# Patient Record
Sex: Female | Born: 1960 | Hispanic: Yes | Marital: Married | Smoking: Never smoker
Health system: Southern US, Community
[De-identification: ages and names within clinical notes are randomized; demographics above are authoritative.]

## PROBLEM LIST (undated history)

## (undated) ENCOUNTER — Emergency Department: Payer: BC Managed Care – PPO | Source: Home / Self Care

## (undated) DIAGNOSIS — F419 Anxiety disorder, unspecified: Secondary | ICD-10-CM

## (undated) DIAGNOSIS — J45909 Unspecified asthma, uncomplicated: Secondary | ICD-10-CM

## (undated) HISTORY — PX: BACK SURGERY: SHX140

---

## 1998-05-19 ENCOUNTER — Encounter: Payer: Self-pay | Admitting: Neurosurgery

## 1998-05-19 ENCOUNTER — Ambulatory Visit (HOSPITAL_COMMUNITY): Admission: RE | Admit: 1998-05-19 | Discharge: 1998-05-19 | Payer: Self-pay | Admitting: Neurosurgery

## 1998-10-07 ENCOUNTER — Ambulatory Visit (HOSPITAL_COMMUNITY): Admission: RE | Admit: 1998-10-07 | Discharge: 1998-10-08 | Payer: Self-pay | Admitting: Orthopaedic Surgery

## 2000-03-29 ENCOUNTER — Inpatient Hospital Stay (HOSPITAL_COMMUNITY): Admission: RE | Admit: 2000-03-29 | Discharge: 2000-03-30 | Payer: Self-pay | Admitting: Orthopaedic Surgery

## 2004-09-20 ENCOUNTER — Ambulatory Visit: Payer: Self-pay | Admitting: General Practice

## 2005-01-10 ENCOUNTER — Ambulatory Visit: Payer: Self-pay

## 2005-11-15 ENCOUNTER — Ambulatory Visit: Payer: Self-pay | Admitting: General Practice

## 2006-12-25 ENCOUNTER — Ambulatory Visit: Payer: Self-pay | Admitting: Endocrinology

## 2009-10-20 ENCOUNTER — Ambulatory Visit: Payer: Self-pay | Admitting: Certified Nurse Midwife

## 2012-04-09 ENCOUNTER — Ambulatory Visit: Payer: Self-pay

## 2014-06-29 ENCOUNTER — Ambulatory Visit: Payer: Self-pay | Admitting: Primary Care

## 2014-07-09 ENCOUNTER — Ambulatory Visit: Payer: Self-pay | Admitting: Primary Care

## 2014-10-29 ENCOUNTER — Other Ambulatory Visit: Payer: Self-pay | Admitting: Otolaryngology

## 2014-10-29 DIAGNOSIS — K118 Other diseases of salivary glands: Secondary | ICD-10-CM

## 2014-11-11 ENCOUNTER — Ambulatory Visit: Admission: RE | Admit: 2014-11-11 | Payer: BLUE CROSS/BLUE SHIELD | Source: Ambulatory Visit

## 2014-11-17 ENCOUNTER — Ambulatory Visit
Admission: RE | Admit: 2014-11-17 | Discharge: 2014-11-17 | Disposition: A | Discharge status: Home / Self Care | Payer: BLUE CROSS/BLUE SHIELD | Source: Ambulatory Visit | Attending: Otolaryngology | Admitting: Otolaryngology

## 2014-11-17 DIAGNOSIS — R590 Localized enlarged lymph nodes: Secondary | ICD-10-CM | POA: Insufficient documentation

## 2014-11-17 DIAGNOSIS — K118 Other diseases of salivary glands: Secondary | ICD-10-CM

## 2014-11-17 DIAGNOSIS — R599 Enlarged lymph nodes, unspecified: Secondary | ICD-10-CM | POA: Diagnosis present

## 2014-11-17 MED ORDER — IOHEXOL 300 MG/ML  SOLN
75.0000 mL | Freq: Once | INTRAMUSCULAR | Status: AC | PRN
  Administered 2014-11-17: 75 mL via INTRAVENOUS

## 2015-09-15 ENCOUNTER — Other Ambulatory Visit: Payer: Self-pay | Admitting: Primary Care

## 2015-09-15 DIAGNOSIS — R928 Other abnormal and inconclusive findings on diagnostic imaging of breast: Secondary | ICD-10-CM

## 2015-09-30 ENCOUNTER — Other Ambulatory Visit: Payer: BLUE CROSS/BLUE SHIELD

## 2015-09-30 ENCOUNTER — Ambulatory Visit: Payer: BLUE CROSS/BLUE SHIELD

## 2015-10-11 ENCOUNTER — Ambulatory Visit
Admission: RE | Admit: 2015-10-11 | Discharge: 2015-10-11 | Disposition: A | Discharge status: Home / Self Care | Payer: BLUE CROSS/BLUE SHIELD | Source: Ambulatory Visit | Attending: Primary Care | Admitting: Primary Care

## 2015-10-11 DIAGNOSIS — R928 Other abnormal and inconclusive findings on diagnostic imaging of breast: Secondary | ICD-10-CM | POA: Diagnosis present

## 2015-10-11 DIAGNOSIS — R921 Mammographic calcification found on diagnostic imaging of breast: Secondary | ICD-10-CM | POA: Insufficient documentation

## 2015-12-30 IMAGING — MG MM ADDL VIEWS W/ TOMO
8 series · 8 of 16 positions shown · non-contrast
Comparison: 06/29/2014 and prior mammograms dating back to
09/20/2004.

CLINICAL DATA: 53-year-old female with right breast calcifications
identified on screening mammogram.

EXAM:
DIGITAL DIAGNOSTIC RIGHT MAMMOGRAM WITH 3D TOMOSYNTHESIS AND CAD

[R ML]
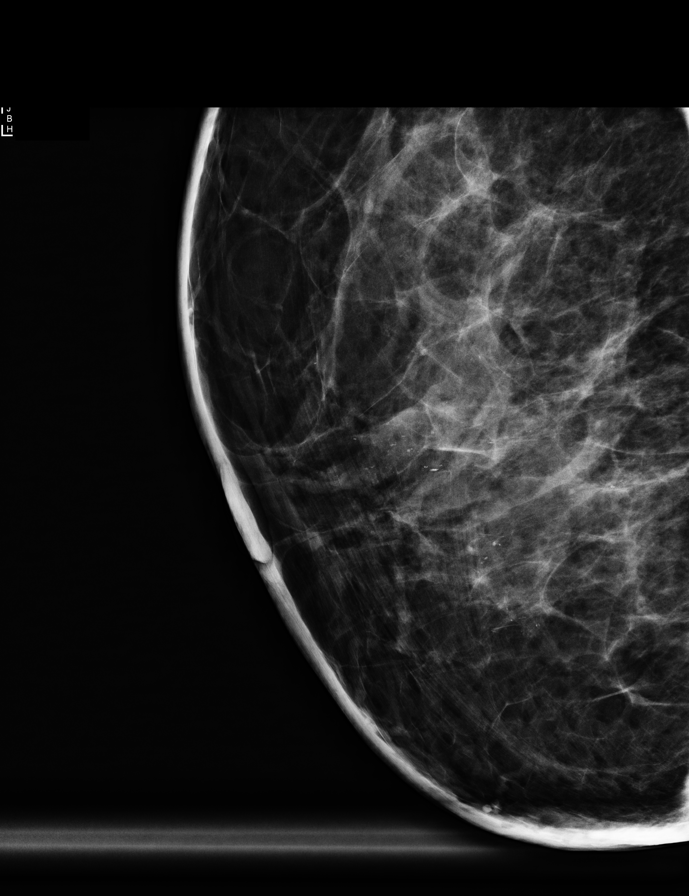

[R CC (1 of 2)]
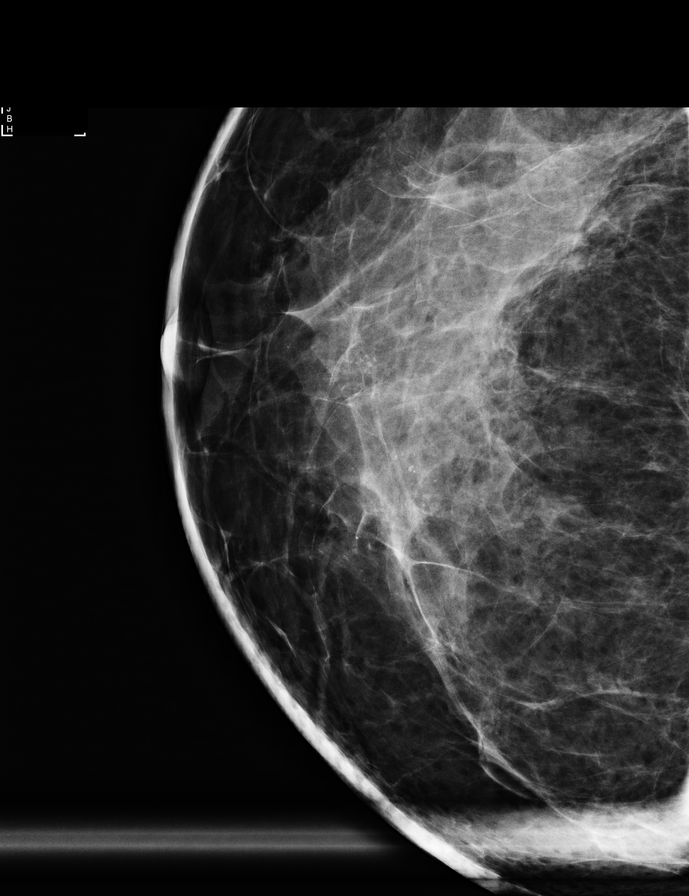

[R CC (2 of 2)]
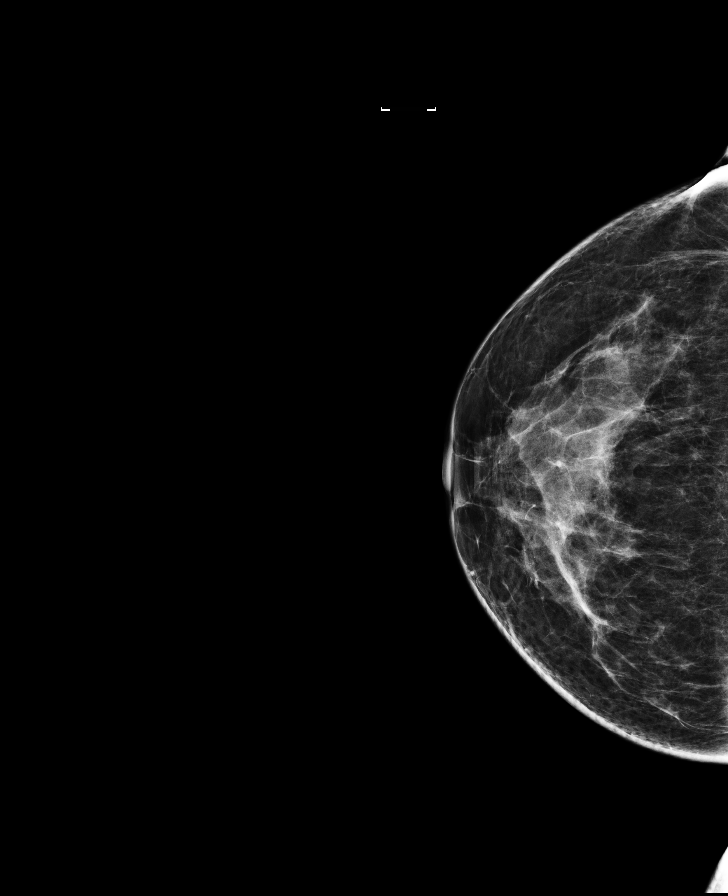

[R MLO synth-2D]
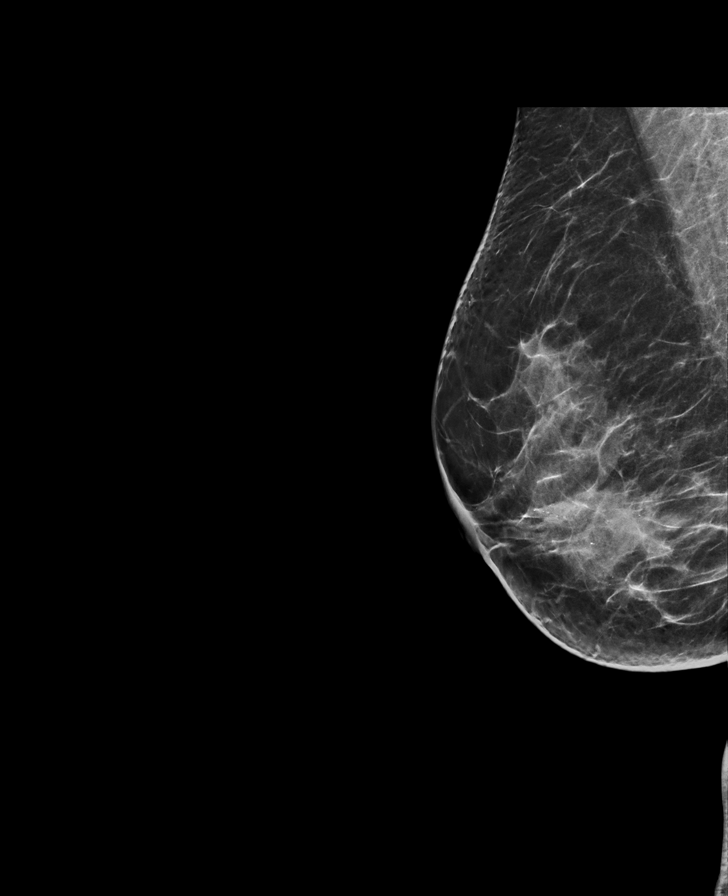

[R MLO]
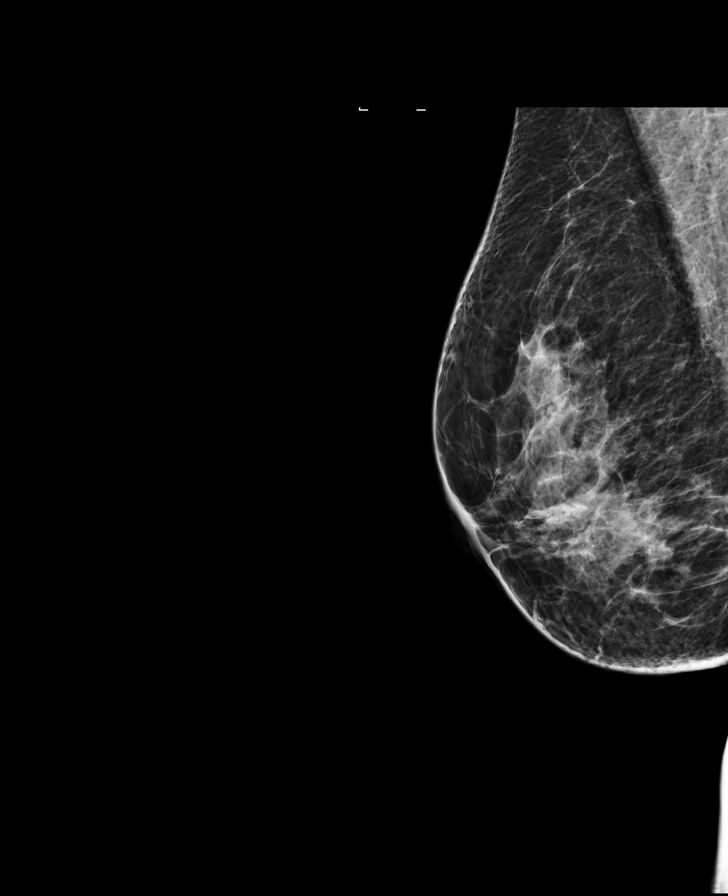

[R CC synth-2D]
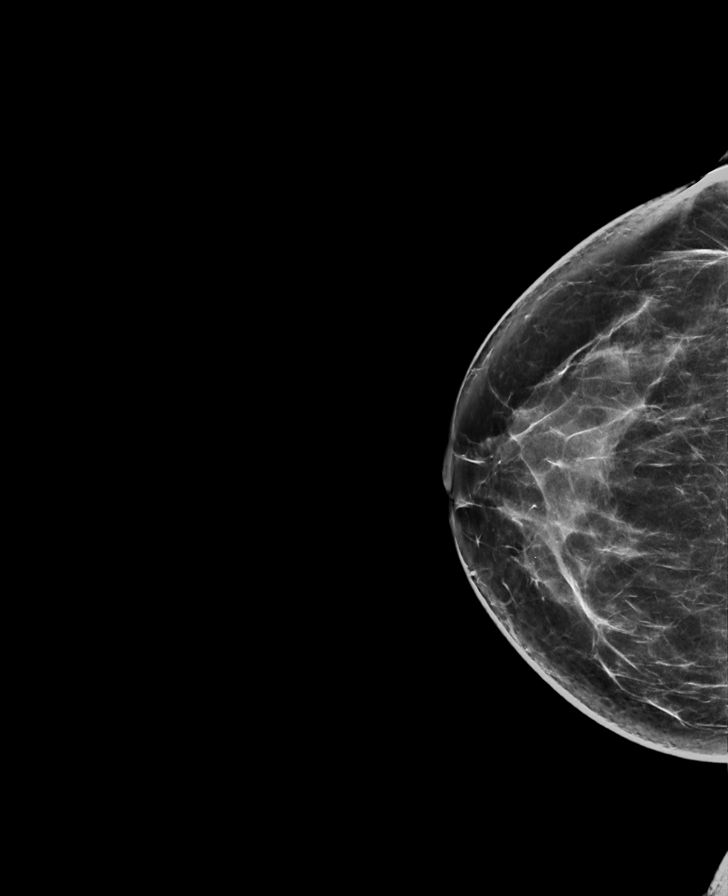

[R CC tomo · tomo slice 40/79.0]
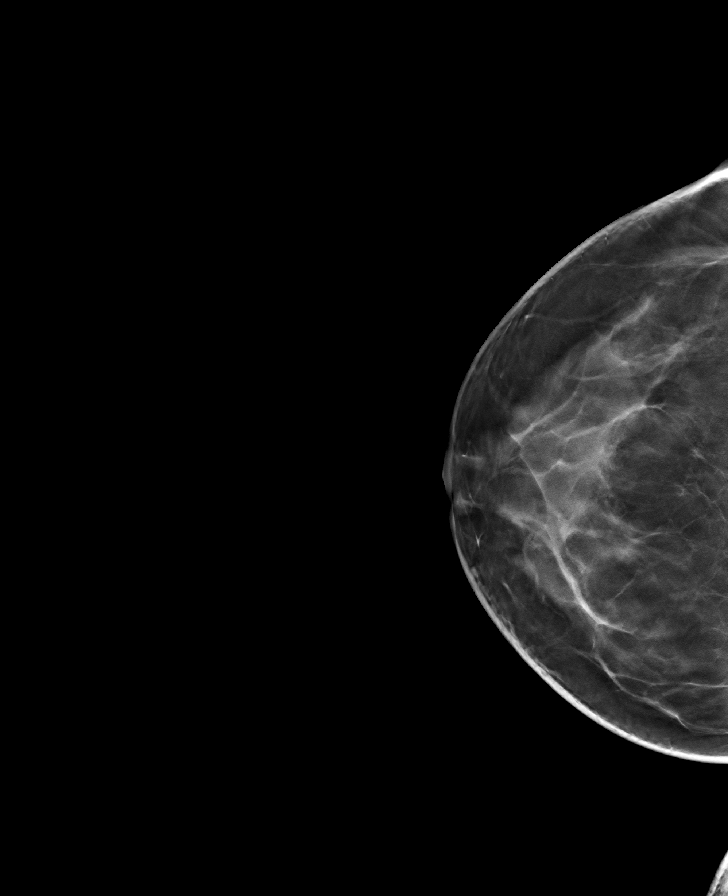

[R MLO tomo · tomo slice 38/75.0]
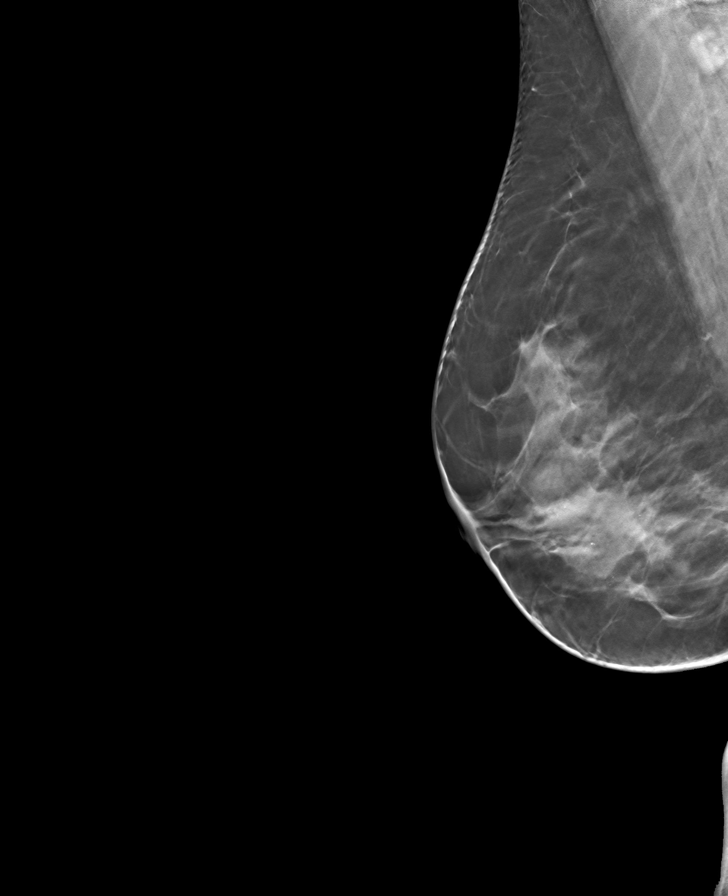

[8 of 16 positions shown; findings below may reference images not displayed]

The patient states she has had mammograms in 1031 at
[facility] [[facility]] and our office will attempt to retrieve these
mammograms.

ACR Breast Density Category c: The breast tissue is heterogeneously
dense, which may obscure small masses.
FINDINGS: Spot magnification views and full field 2D and 3D views of the right
breast demonstrate scattered calcifications within the retroareolar
and lower inner right breast. Many of the calcifications within the
retroareolar right breast layer compatible with milk of calcium.
There is equivocal layering of calcifications within the lower inner
right breast.

Mammographic images were processed with CAD.
IMPRESSION: Benign and likely benign right breast calcifications. Six-month
followup is recommended to ensure stability.

RECOMMENDATION:
Right diagnostic mammogram with spot-magnification views in 6
months. If prior outside mammograms are obtained, an addendum will
be issued.

I have discussed the findings and recommendations with the patient.
Results were also provided in writing at the conclusion of the
visit. If applicable, a reminder letter will be sent to the patient
regarding the next appointment.

BI-RADS CATEGORY  3: Probably benign.

## 2016-02-06 ENCOUNTER — Other Ambulatory Visit: Payer: Self-pay | Admitting: Otolaryngology

## 2016-02-06 DIAGNOSIS — R221 Localized swelling, mass and lump, neck: Secondary | ICD-10-CM

## 2016-02-10 ENCOUNTER — Ambulatory Visit
Admission: RE | Admit: 2016-02-10 | Discharge: 2016-02-10 | Disposition: A | Discharge status: Home / Self Care | Payer: BLUE CROSS/BLUE SHIELD | Source: Ambulatory Visit | Attending: Otolaryngology | Admitting: Otolaryngology

## 2016-02-10 DIAGNOSIS — R221 Localized swelling, mass and lump, neck: Secondary | ICD-10-CM | POA: Insufficient documentation

## 2016-02-10 MED ORDER — IOPAMIDOL (ISOVUE-300) INJECTION 61%
75.0000 mL | Freq: Once | INTRAVENOUS | Status: AC | PRN
Start: 2016-02-10 — End: 2016-02-10
  Administered 2016-02-10: 75 mL via INTRAVENOUS

## 2016-04-10 ENCOUNTER — Encounter
Admission: RE | Admit: 2016-04-10 | Discharge: 2016-04-10 | Disposition: A | Discharge status: Home / Self Care | Payer: BLUE CROSS/BLUE SHIELD | Source: Ambulatory Visit | Attending: Otolaryngology | Admitting: Otolaryngology

## 2016-04-10 DIAGNOSIS — Z01812 Encounter for preprocedural laboratory examination: Secondary | ICD-10-CM | POA: Insufficient documentation

## 2016-04-10 HISTORY — DX: Anxiety disorder, unspecified: F41.9

## 2016-04-10 HISTORY — DX: Unspecified asthma, uncomplicated: J45.909

## 2016-04-10 LAB — CBC
HCT: 39.7 % (ref 35.0–47.0)
HEMOGLOBIN: 13.4 g/dL (ref 12.0–16.0)
MCH: 30.8 pg (ref 26.0–34.0)
MCHC: 33.7 g/dL (ref 32.0–36.0)
MCV: 91.5 fL (ref 80.0–100.0)
Platelets: 286 10*3/uL (ref 150–440)
RBC: 4.34 MIL/uL (ref 3.80–5.20)
RDW: 13.4 % (ref 11.5–14.5)
WBC: 7.5 10*3/uL (ref 3.6–11.0)

## 2016-04-10 LAB — DIFFERENTIAL
Basophils Absolute: 0 10*3/uL (ref 0–0.1)
Basophils Relative: 0 %
EOS ABS: 0.2 10*3/uL (ref 0–0.7)
EOS PCT: 3 %
LYMPHS ABS: 3.7 10*3/uL — AB (ref 1.0–3.6)
LYMPHS PCT: 49 %
MONO ABS: 0.6 10*3/uL (ref 0.2–0.9)
MONOS PCT: 7 %
Neutro Abs: 3.1 10*3/uL (ref 1.4–6.5)
Neutrophils Relative %: 41 %

## 2016-04-10 NOTE — Patient Instructions (Signed)
Your procedure is scheduled on: April 25, 2016 (Wednesday) Su procedimiento est programado para: Report to  Same Day Surgery. Medical Mall, Second Floor Presntese a: To find out your arrival time please call [phone] between 1PM - 3PM on April 24, 2016 (Tuesday). Para saber su hora de llegada por favor llame al [phone] entre la 1PM - 3PM el da:  Remember: Instructions that are not followed completely may result in serious medical risk, up to and including death, or upon the discretion of your surgeon and anesthesiologist your surgery may need to be rescheduled.  Recuerde: Las instrucciones que no se siguen completamente Heritage manager en un riesgo de salud grave, incluyendo hasta la [place] o a discrecin de su cirujano y Environmental health practitioner, su ciruga se puede posponer.   _x___ 1. Do not eat food or drink liquids after midnight. No gum chewing or hard candies.  No coma alimentos ni tome lquidos despus de la medianoche.  No mastique chicle ni caramelos  duros.     _x___ 2. No alcohol for 24 hours before or after surgery.    No tome alcohol durante las 24 horas antes ni despus de la [place].   ____ 3. Bring all medications with you on the day of surgery if instructed.    Lleve todos los medicamentos con usted el da de su ciruga si se le ha indicado as.   __x__ 4. Notify your doctor if there is any change in your medical condition (cold, fever,                             infections).    Informe a su mdico si hay algn cambio en su condicin mdica (resfriado, fiebre, infecciones).   Do not wear jewelry, make-up, hairpins, clips or nail polish.  No use joyas, maquillajes, pinzas/ganchos para el cabello ni esmalte de uas.  Do not wear lotions, powders, or perfumes. You may wear deodorant.  No use lociones, polvos o perfumes.  Puede usar desodorante.    Do not shave 48 hours prior to surgery. Men may shave face and neck.  No se afeite 48 horas antes de la [place].   Los hombres pueden [employer] cara y el cuello.   Do not bring valuables to the hospital.   No lleve objetos [facility] is not responsible for any belongings or valuables.  [place] no se hace responsable de ningn tipo de pertenencias u objetos de Geographical information systems officer.               Contacts, dentures or bridgework may not be worn into surgery.  Los lentes de [place], las dentaduras postizas o puentes no se pueden usar en la [place].  Leave your suitcase in the car. After surgery it may be brought to your room.  Deje su maleta en el auto.  Despus de la ciruga podr traerla a su habitacin.  For patients admitted to the hospital, discharge time is determined by your treatment team.  Para los pacientes que sean ingresados al hospital, el tiempo en el cual se le dar de alta es determinado por su                equipo de [place].   Patients discharged the day of surgery will not be allowed to drive home. A los pacientes que se les da de alta el mismo da de la ciruga no se les permitir conducir a Holiday representative.  Please read over the following fact sheets that you were given: Por favor [place] informacin que le dieron:     ____ Take these medicines the morning of surgery with A SIP OF WATER:          [place] de la ciruga con [place]:  1.   2.   3.   4.       5.  6.  ____ Fleet Enema (as directed)          Enema de Fleet (segn lo indicado)    ____ Use CHG Soap as directed          Utilice el jabn de CHG segn lo indicado  ____ Use inhalers on the day of surgery          Use los inhaladores el da de la ciruga  ____ Stop metformin 2 days prior to surgery          Deje de tomar el metformin 2 das antes de la ciruga    ____ Take 1/2 of usual insulin dose the night before surgery and none on the morning of surgery           Tome la mitad de la dosis habitual de insulina la noche antes de la [place] y no tome  nada en la maana de la             ciruga  __x__ Stop Coumadin/Plavix/aspirin on (NO ASPIRIN)          Deje de tomar el Coumadin/Plavix/aspirina el da:  _x___ Stop Anti-inflammatories on (NO NSAIDS)          Deje de tomar antiinflamatorios el da:   ____ Stop supplements until after surgery            Deje de tomar suplementos hasta despus de la ciruga  ____ Bring C-Pap to the hospital          [place] al hospital

## 2016-04-25 ENCOUNTER — Ambulatory Visit
Admission: RE | Admit: 2016-04-25 | Discharge: 2016-04-25 | Disposition: A | Discharge status: Home / Self Care | Payer: BLUE CROSS/BLUE SHIELD | Source: Ambulatory Visit | Attending: Otolaryngology | Admitting: Otolaryngology

## 2016-04-25 ENCOUNTER — Encounter
Admission: RE | Disposition: A | Discharge status: Home / Self Care | Payer: Self-pay | Source: Ambulatory Visit | Attending: Otolaryngology

## 2016-04-25 ENCOUNTER — Ambulatory Visit: Payer: BLUE CROSS/BLUE SHIELD | Admitting: Anesthesiology

## 2016-04-25 ENCOUNTER — Encounter: Payer: Self-pay | Admitting: *Deleted

## 2016-04-25 ENCOUNTER — Other Ambulatory Visit
Admission: RE | Admit: 2016-04-25 | Discharge: 2016-04-25 | Disposition: A | Discharge status: Home / Self Care | Payer: BLUE CROSS/BLUE SHIELD | Source: Ambulatory Visit | Attending: Otolaryngology | Admitting: Otolaryngology

## 2016-04-25 DIAGNOSIS — F419 Anxiety disorder, unspecified: Secondary | ICD-10-CM | POA: Insufficient documentation

## 2016-04-25 DIAGNOSIS — L0211 Cutaneous abscess of neck: Secondary | ICD-10-CM | POA: Insufficient documentation

## 2016-04-25 DIAGNOSIS — I881 Chronic lymphadenitis, except mesenteric: Secondary | ICD-10-CM | POA: Diagnosis present

## 2016-04-25 HISTORY — PX: LYMPH NODE BIOPSY: SHX201

## 2016-04-25 LAB — POCT PREGNANCY, URINE: Preg Test, Ur: NEGATIVE

## 2016-04-25 SURGERY — LYMPH NODE BIOPSY
Anesthesia: General | Laterality: Left | Wound class: Clean

## 2016-04-25 MED ORDER — DEXAMETHASONE SODIUM PHOSPHATE 10 MG/ML IJ SOLN
INTRAMUSCULAR | Status: DC | PRN
  Administered 2016-04-25: 10 mg via INTRAVENOUS

## 2016-04-25 MED ORDER — DEXAMETHASONE SODIUM PHOSPHATE 10 MG/ML IJ SOLN
INTRAMUSCULAR | Status: AC
  Filled 2016-04-25: qty 1

## 2016-04-25 MED ORDER — DOXYCYCLINE HYCLATE 100 MG PO CAPS: ORAL_CAPSULE | ORAL | 0 refills | Status: DC

## 2016-04-25 MED ORDER — FENTANYL CITRATE (PF) 100 MCG/2ML IJ SOLN
INTRAMUSCULAR | Status: AC
  Filled 2016-04-25: qty 2

## 2016-04-25 MED ORDER — PHENYLEPHRINE 40 MCG/ML (10ML) SYRINGE FOR IV PUSH (FOR BLOOD PRESSURE SUPPORT)
PREFILLED_SYRINGE | INTRAVENOUS | Status: AC
  Filled 2016-04-25: qty 10

## 2016-04-25 MED ORDER — FENTANYL CITRATE (PF) 100 MCG/2ML IJ SOLN
INTRAMUSCULAR | Status: DC | PRN
  Administered 2016-04-25 (×2): 50 ug via INTRAVENOUS

## 2016-04-25 MED ORDER — PHENYLEPHRINE HCL 10 MG/ML IJ SOLN
INTRAMUSCULAR | Status: DC | PRN
  Administered 2016-04-25 (×6): 80 ug via INTRAVENOUS

## 2016-04-25 MED ORDER — BACITRACIN 500 UNIT/GM EX OINT
TOPICAL_OINTMENT | CUTANEOUS | Status: DC | PRN
  Administered 2016-04-25: 1 via TOPICAL

## 2016-04-25 MED ORDER — SUGAMMADEX SODIUM 200 MG/2ML IV SOLN
INTRAVENOUS | Status: AC
  Filled 2016-04-25: qty 2

## 2016-04-25 MED ORDER — HYDROCODONE-ACETAMINOPHEN 5-325 MG PO TABS: 1.0000 | ORAL_TABLET | ORAL | 0 refills | Status: DC | PRN

## 2016-04-25 MED ORDER — LIDOCAINE-EPINEPHRINE 2 %-1:100000 IJ SOLN
INTRAMUSCULAR | Status: DC | PRN
  Administered 2016-04-25: 3 mL via INTRADERMAL

## 2016-04-25 MED ORDER — PROPOFOL 10 MG/ML IV BOLUS
INTRAVENOUS | Status: DC | PRN
  Administered 2016-04-25: 160 mg via INTRAVENOUS

## 2016-04-25 MED ORDER — SUCCINYLCHOLINE CHLORIDE 20 MG/ML IJ SOLN
INTRAMUSCULAR | Status: DC | PRN
  Administered 2016-04-25: 120 mg via INTRAVENOUS

## 2016-04-25 MED ORDER — ONDANSETRON HCL 4 MG/2ML IJ SOLN
INTRAMUSCULAR | Status: AC
  Filled 2016-04-25: qty 2

## 2016-04-25 MED ORDER — FENTANYL CITRATE (PF) 100 MCG/2ML IJ SOLN: 25.0000 ug | INTRAMUSCULAR | Status: DC | PRN

## 2016-04-25 MED ORDER — EPHEDRINE SULFATE 50 MG/ML IJ SOLN
INTRAMUSCULAR | Status: DC | PRN
  Administered 2016-04-25: 5 mg via INTRAVENOUS
  Administered 2016-04-25: 10 mg via INTRAVENOUS

## 2016-04-25 MED ORDER — SUCCINYLCHOLINE CHLORIDE 200 MG/10ML IV SOSY
PREFILLED_SYRINGE | INTRAVENOUS | Status: AC
  Filled 2016-04-25: qty 10

## 2016-04-25 MED ORDER — LIDOCAINE-EPINEPHRINE 2 %-1:100000 IJ SOLN
INTRAMUSCULAR | Status: AC
  Filled 2016-04-25: qty 50

## 2016-04-25 MED ORDER — LACTATED RINGERS IV SOLN
INTRAVENOUS | Status: DC | PRN
  Administered 2016-04-25: 13:00:00 via INTRAVENOUS

## 2016-04-25 MED ORDER — MIDAZOLAM HCL 2 MG/2ML IJ SOLN
INTRAMUSCULAR | Status: DC | PRN
  Administered 2016-04-25: 2 mg via INTRAVENOUS

## 2016-04-25 MED ORDER — PROPOFOL 10 MG/ML IV BOLUS
INTRAVENOUS | Status: AC
  Filled 2016-04-25: qty 20

## 2016-04-25 MED ORDER — BACITRACIN ZINC 500 UNIT/GM EX OINT
TOPICAL_OINTMENT | CUTANEOUS | Status: AC
  Filled 2016-04-25: qty 28.35

## 2016-04-25 MED ORDER — MIDAZOLAM HCL 2 MG/2ML IJ SOLN
INTRAMUSCULAR | Status: AC
Start: 2016-04-25 — End: 2016-04-25
  Filled 2016-04-25: qty 2

## 2016-04-25 MED ORDER — LIDOCAINE 2% (20 MG/ML) 5 ML SYRINGE
INTRAMUSCULAR | Status: AC
  Filled 2016-04-25: qty 5

## 2016-04-25 MED ORDER — LIDOCAINE HCL (CARDIAC) 20 MG/ML IV SOLN
INTRAVENOUS | Status: DC | PRN
  Administered 2016-04-25: 80 mg via INTRAVENOUS

## 2016-04-25 MED ORDER — PROMETHAZINE HCL 25 MG/ML IJ SOLN: 6.2500 mg | INTRAMUSCULAR | Status: DC | PRN

## 2016-04-25 MED ORDER — LACTATED RINGERS IV SOLN
INTRAVENOUS | Status: DC
  Administered 2016-04-25: 11:00:00 via INTRAVENOUS

## 2016-04-25 MED ORDER — ONDANSETRON HCL 4 MG/2ML IJ SOLN
INTRAMUSCULAR | Status: DC | PRN
  Administered 2016-04-25: 4 mg via INTRAVENOUS

## 2016-04-25 SURGICAL SUPPLY — 25 items
BLADE SURG 15 STRL LF DISP TIS (BLADE) ×1 IMPLANT
BLADE SURG 15 STRL SS (BLADE) ×3
CANISTER SUCT 1200ML W/VALVE (MISCELLANEOUS) ×3 IMPLANT
CORD BIP STRL DISP 12FT (MISCELLANEOUS) ×3 IMPLANT
DRESSING TELFA 4X3 1S ST N-ADH (GAUZE/BANDAGES/DRESSINGS) ×3 IMPLANT
DRSG TEGADERM 2-3/8X2-3/4 SM (GAUZE/BANDAGES/DRESSINGS) ×3 IMPLANT
ELECT CAUTERY BLADE TIP 2.5 (TIP) ×3
ELECT REM PT RETURN 9FT ADLT (ELECTROSURGICAL) ×3
ELECTRODE CAUTERY BLDE TIP 2.5 (TIP) ×1 IMPLANT
ELECTRODE REM PT RTRN 9FT ADLT (ELECTROSURGICAL) ×1 IMPLANT
FORCEPS JEWEL BIP 4-3/4 STR (INSTRUMENTS) ×3 IMPLANT
GLOVE BIO SURGEON STRL SZ7.5 (GLOVE) ×5 IMPLANT
GOWN STRL REUS W/ TWL LRG LVL3 (GOWN DISPOSABLE) ×2 IMPLANT
GOWN STRL REUS W/TWL LRG LVL3 (GOWN DISPOSABLE) ×6
HARMONIC SCALPEL FOCUS (MISCELLANEOUS) ×3 IMPLANT
LABEL OR SOLS (LABEL) ×3 IMPLANT
LIQUID BAND (GAUZE/BANDAGES/DRESSINGS) ×1 IMPLANT
NS IRRIG 500ML POUR BTL (IV SOLUTION) ×3 IMPLANT
PACK HEAD/NECK (MISCELLANEOUS) ×3 IMPLANT
SUCTION FRAZIER HANDLE 10FR (MISCELLANEOUS) ×2
SUCTION TUBE FRAZIER 10FR DISP (MISCELLANEOUS) ×1 IMPLANT
SUT SILK 3 0 (SUTURE)
SUT SILK 3-0 18XBRD TIE 12 (SUTURE) ×1 IMPLANT
SUT VIC AB 4-0 FS2 27 (SUTURE) ×1 IMPLANT
SUT VIC AB 4-0 RB1 18 (SUTURE) ×3 IMPLANT

## 2016-04-25 NOTE — Anesthesia Postprocedure Evaluation (Signed)
Anesthesia Post Note  Patient: Jessica Fischer  Procedure(s) Performed: Procedure(s) (LRB): LYMPH NODE BIOPSY (Left)  Patient location during evaluation: PACU Anesthesia Type: General Level of consciousness: awake and alert and oriented Pain management: pain level controlled Vital Signs Assessment: post-procedure vital signs reviewed and stable Respiratory status: spontaneous breathing, nonlabored ventilation and respiratory function stable Cardiovascular status: blood pressure returned to baseline and stable Postop Assessment: no signs of nausea or vomiting Anesthetic complications: no     Last Vitals:  Vitals:   04/25/16 1423 04/25/16 1426  BP:  129/84  Pulse: 81 74  Resp: 18 10  Temp:  36.2 C    Last Pain:  Vitals:   04/25/16 1103  TempSrc: Tympanic                 [name]

## 2016-04-25 NOTE — Transfer of Care (Signed)
Immediate Anesthesia Transfer of Care Note  Patient: Jessica Fischer  Procedure(s) Performed: Procedure(s): LYMPH NODE BIOPSY (Left)  Patient Location: PACU  Anesthesia Type:General  Level of Consciousness: awake, alert  and responds to stimulation  Airway & Oxygen Therapy: Patient Spontanous Breathing and Patient connected to nasal cannula oxygen  Post-op Assessment: Report given to RN and Post -op Vital signs reviewed and stable  Post vital signs: Reviewed and stable  Last Vitals:  Vitals:   04/25/16 1103 04/25/16 1355  BP: (!) 153/86 121/79  Pulse: 77 93  Resp: 16 (!) 25  Temp: 36.2 C 36.1 C    Last Pain:  Vitals:   04/25/16 1103  TempSrc: Tympanic         Complications: No apparent anesthesia complications

## 2016-04-25 NOTE — H&P (Signed)
History and physical reviewed and will be scanned in later. No change in medical status reported by the patient or family, appears stable for surgery. All questions regarding the procedure answered, and patient (or family if a child) expressed understanding of the procedure.  Jessica Fischer S @TODAY@ 

## 2016-04-25 NOTE — Op Note (Signed)
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 Node was sent for pathology and some of the material from within the node sent for culture  Procedure: After the patient was identified in holding and the procedure was reviewed.  The patient was taken to the operating room and with the patient in a comfortable supine position,  general endotracheal anesthesia was induced without difficulty.  A proper time-out was performed, confirming the operative site and procedure.  2% lidocaine with epinephrine 1 100,000 was injected in the skin overlying the mass. The left neck was then prepped and draped in the usual sterile fashion. Facial nerve monitor electrodes were placed to monitor the lower lip. Proper functioning was confirmed.  The skin overlying the lymph node was then excised in an ellipse, as the skin immediately over the node was inflamed and erythematous. An adequate skin margin was taken so that normal healthy skin remained. The incision was carried down to the subcutaneous fatty tissues and the node carefully dissected. The node was adherent to the underlying platysma muscle and was carefully dissected off with a combination of sharp and bipolar cautery dissection. The lip was carefully monitored both visually and with the facial nerve monitor during this dissection. While some of the platysma muscle might have been focally removed with the inflamed mass, grossly the muscle was not significantly violated, reducing any risk to the marginal nerve.  The specimen was delivered and  sent for pathology, and a culture taken from a focal area of the node that had ruptured. None of the purulent material was spilled into the wound however. The wound was irrigated with sterile saline and hemostasis obtained. The wound was then closed in layers with 4-0 Vicryl suture for the subcutaneous closure followed by 5-0 Prolene suture in a running locked stitch for the skin. Bacitracin ointment was applied to the wound.  The patient was then returned to the anesthesiologist in good condition for awakening. The patient was awakened and taken to the recovery room in good condition.   Disposition:   PACU then discharge home  Plan: Bacitracin ointment to wound twice daily. Follow-up in 1 week for suture removal.  Riley Nearing 04/25/2016 1:46 PM

## 2016-04-25 NOTE — Anesthesia Preprocedure Evaluation (Signed)
Anesthesia Evaluation  Patient identified by MRN, date of birth, ID band Patient awake    Reviewed: Allergy & Precautions, H&P , NPO status , Patient's Chart, lab work & pertinent test results, reviewed documented beta blocker date and time   Airway Mallampati: III  TM Distance: >3 FB Neck ROM: full    Dental no notable dental hx. (+) Teeth Intact   Pulmonary neg shortness of breath, asthma , neg recent URI,    Pulmonary exam normal breath sounds clear to auscultation       Cardiovascular Exercise Tolerance: Good negative cardio ROS Normal cardiovascular exam Rhythm:regular Rate:Normal     Neuro/Psych PSYCHIATRIC DISORDERS (anxiety) negative neurological ROS     GI/Hepatic negative GI ROS, Neg liver ROS,   Endo/Other  negative endocrine ROS  Renal/GU negative Renal ROS  negative genitourinary   Musculoskeletal   Abdominal   Peds  Hematology negative hematology ROS (+)   Anesthesia Other Findings Past Medical History: No date: Anxiety No date: Asthma     Comment: NO INHALERS   Reproductive/Obstetrics negative OB ROS                             Anesthesia Physical Anesthesia Plan  ASA: II  Anesthesia Plan: General   Post-op Pain Management:    Induction:   Airway Management Planned:   Additional Equipment:   Intra-op Plan:   Post-operative Plan:   Informed Consent: I have reviewed the patients History and Physical, chart, labs and discussed the procedure including the risks, benefits and alternatives for the proposed anesthesia with the patient or authorized representative who has indicated his/her understanding and acceptance.   Dental Advisory Given  Plan Discussed with: Anesthesiologist, CRNA and Surgeon  Anesthesia Plan Comments:         Anesthesia Quick Evaluation

## 2016-04-25 NOTE — Discharge Instructions (Signed)
CIRUGIA AMBULATORIA       Instruccionnes de alta    Date (Fecha)   1.  Las drogas que se Statistician en su cuerpo [employer], asi  que por las proximas 24 horas usted no debe:   Conducir Scientist, research (medical)) un automovil   Hacer ninguna decision legal   Tomar ninguna bebida alcoholica  2.  A) [place] puede comenzar una dieta regular.  Es mejor que hoy empiece con  liquidos y gradualmente anada comidas solidas.       B) Puede comer cualquier comida que desee pero es mejor empezar con liquidos, luego sopitas con galletas saladas y gradualmente llegar a las comidas solidas.  3.  Por favor avise a su medico inmediatamente si usted tiene algun sangrado anormal,  tiene dificultad con la respiracion, enrojecimiento y Social research officer, government en el sitio de la cirugia,     [place], fiebro o dolor que se alivia con [place].  4.  A) Su visita posoperatoria (despues de su operacion) es con el  Dr.  Date                   Time         B)  Por favor llame para hacer la cita posoperatoria.  5.  Istrucciones especificas :    CIRUGIA AMBULATORIA       Instruccionnes de alta    Date (Fecha)   1.  Las drogas que se Statistician en su cuerpo [employer], asi  que por las proximas 24 horas usted no debe:   Conducir Scientist, research (medical)) un automovil   Hacer ninguna decision legal   Tomar ninguna bebida alcoholica  2.  A) [place] puede comenzar una dieta regular.  Es mejor que hoy empiece con liquidos y gradualmente anada comidas solidas.       B) Puede comer cualquier comida que desee pero es mejor empezar con liquidos,  luego sopitas con galletas saladas y gradualmente llegar a las comidas solidas.  3.  Por favor avise a su medico inmediatamente si usted tiene algun sangrado anormal,  tiene dificultad con la respiracion, enrojecimiento y Social research officer, government en el sitio de la cirugia,     [place], fiebro o dolor que se alivia con [place].  4.  A) Su visita posoperatoria (despues de su operacion) es con  eAMBULATORY SURGERY  DISCHARGE INSTRUCTIONS   1) The drugs that you were given will stay in your system until tomorrow so for the next 24 hours you should not:  A) Drive an automobile B) Make any legal decisions C) Drink any alcoholic beverage   2) You may resume regular meals tomorrow.  Today it is better to start with liquids and gradually work up to solid foods.  You may eat anything you prefer, but it is better to start with liquids, then soup and crackers, and gradually work up to solid foods.   3) Please notify your doctor immediately if you have any unusual bleeding, trouble breathing, redness and pain at the surgery site, drainage, fever, or pain not relieved by medication.    4) Additional Instructions:        Please contact your physician with any problems or Same Day Surgery at [phone], Monday through Friday 6 am to 4 pm, or [place] at [facility] number at [phone].AMBULATORY SURGERY  DISCHARGE INSTRUCTIONS   5) The drugs that you were given will stay in your system until tomorrow so for the next 24 hours you should not:  D) Drive  an automobile E) Make any legal decisions F) Drink any alcoholic beverage   6) You may resume regular meals tomorrow.  Today it is better to start with liquids and gradually work up to solid foods.  You may eat anything you prefer, but it is better to start with liquids, then soup and crackers, and gradually work up to solid foods.   7) Please notify your doctor immediately if you have any unusual bleeding, trouble breathing, redness and pain at the surgery site, drainage, fever, or pain not relieved by medication.    8) Additional Instructions:        Please contact your physician with any problems or Same Day Surgery at [phone], Monday through Friday 6 am to 4 pm, or [place] at [facility] number at [phone].                Time         B)  Por favor llame para hacer la cita  posoperatoria.  5.  Istrucciones especificas :

## 2016-04-25 NOTE — Anesthesia Procedure Notes (Signed)
Procedure Name: Intubation Date/Time: 04/25/2016 1:02 PM Performed by: Darlyne Russian Pre-anesthesia Checklist: Patient identified, Emergency Drugs available, Suction available, Patient being monitored and Timeout performed Patient Re-evaluated:Patient Re-evaluated prior to inductionOxygen Delivery Method: Circle system utilized Preoxygenation: Pre-oxygenation with 100% oxygen Intubation Type: IV induction Ventilation: Mask ventilation without difficulty Laryngoscope Size: Mac and 3 Grade View: Grade III Tube type: Oral Tube size: 7.0 mm Number of attempts: 1 Airway Equipment and Method: Stylet Placement Confirmation: ETT inserted through vocal cords under direct vision,  positive ETCO2 and breath sounds checked- equal and bilateral Secured at: 21 cm Tube secured with: Tape Dental Injury: Teeth and Oropharynx as per pre-operative assessment

## 2016-04-25 NOTE — Progress Notes (Signed)
Throat sore  Ice chips given

## 2016-04-27 LAB — SURGICAL PATHOLOGY

## 2016-04-28 LAB — ACID FAST SMEAR (AFB): ACID FAST SMEAR - AFSCU2: NEGATIVE

## 2016-05-11 LAB — AEROBIC/ANAEROBIC CULTURE (SURGICAL/DEEP WOUND)

## 2016-05-11 LAB — AEROBIC/ANAEROBIC CULTURE W GRAM STAIN (SURGICAL/DEEP WOUND)

## 2016-05-23 LAB — FUNGAL ORGANISM REFLEX

## 2016-05-23 LAB — FUNGUS CULTURE WITH STAIN

## 2016-06-09 LAB — ACID FAST CULTURE WITH REFLEXED SENSITIVITIES (MYCOBACTERIA): Acid Fast Culture: NEGATIVE

## 2016-06-09 LAB — ACID FAST CULTURE WITH REFLEXED SENSITIVITIES

## 2016-10-26 ENCOUNTER — Other Ambulatory Visit: Payer: Self-pay | Admitting: Primary Care

## 2016-10-26 DIAGNOSIS — Z1231 Encounter for screening mammogram for malignant neoplasm of breast: Secondary | ICD-10-CM

## 2016-11-12 ENCOUNTER — Ambulatory Visit
Admission: RE | Admit: 2016-11-12 | Discharge: 2016-11-12 | Disposition: A | Discharge status: Home / Self Care | Payer: BLUE CROSS/BLUE SHIELD | Source: Ambulatory Visit | Attending: Primary Care | Admitting: Primary Care

## 2016-11-12 ENCOUNTER — Other Ambulatory Visit: Payer: Self-pay | Admitting: Primary Care

## 2016-11-12 DIAGNOSIS — Z1231 Encounter for screening mammogram for malignant neoplasm of breast: Secondary | ICD-10-CM | POA: Diagnosis present

## 2017-10-24 ENCOUNTER — Other Ambulatory Visit: Payer: Self-pay | Admitting: Nurse Practitioner

## 2017-10-24 ENCOUNTER — Other Ambulatory Visit: Payer: Self-pay | Admitting: Primary Care

## 2017-10-24 DIAGNOSIS — Z1231 Encounter for screening mammogram for malignant neoplasm of breast: Secondary | ICD-10-CM

## 2017-10-25 ENCOUNTER — Encounter: Payer: Self-pay | Admitting: *Deleted

## 2017-11-19 ENCOUNTER — Ambulatory Visit: Payer: BLUE CROSS/BLUE SHIELD | Admitting: Gastroenterology

## 2017-11-19 VITALS — BP 122/82 | HR 80 | Ht 64.0 in

## 2017-11-19 DIAGNOSIS — R1310 Dysphagia, unspecified: Secondary | ICD-10-CM | POA: Diagnosis not present

## 2017-11-19 NOTE — Patient Instructions (Signed)
F/U 3 months Miralax  Dieta rica en fibra High-Fiber Diet La fibra, tambin llamada fibra dietaria, es un tipo de carbohidrato que se encuentra en las frutas, las verduras, los cereales integrales y los frijoles. Una dieta rica en fibra puede tener muchos beneficios para la salud. El mdico puede recomendar una dieta rica en fibra para ayudar a:  Contractor. La fibra puede hacer que defeque con ms frecuencia.  Disminuir el nivel de colesterol.  Ardoch hemorroides, la diverticulosis no complicada o el sndrome de colon irritable.  Evitar comer en exceso como parte de un plan para bajar de peso.  Evitar la enfermedad cardaca, la diabetes tipo 2 y ciertos cnceres.  En qu consiste el plan? El consumo diario recomendado de fibra incluye lo siguiente:  38gramos para los hombres menores de 20 aos.  30gramos para los hombres mayores de 50 aos.  25gramos para las mujeres menores de 50 aos.  21gramos para las [employer] de 50 aos.  Puede lograr el consumo diario recomendado de fibra si come una variedad de frutas, verduras, cereales y frijoles. El mdico tambin puede recomendar un suplemento de fibra si no es posible obtener suficiente fibra a travs de la dieta. Qu debo saber acerca de la dieta rica en fibra?  La eficacia de los suplementos de [place] no ha sido estudiada ampliamente, de modo que es mejor obtener fibra directamente de los alimentos.  Verifique siempre el contenido de fibra en la etiqueta de informacin nutricional de los alimentos preenvasados. Busque alimentos que contengan al menos 5gramos de fibra por porcin.  Consulte a un nutricionista si tiene preguntas sobre algunos alimentos especficos relacionados con su enfermedad, especialmente si estos alimentos no se mencionan a continuacin.  Aumente el consumo diario de fibra en forma gradual. Aumentar demasiado rpido el consumo de fibra dietaria puede provocar distensin abdominal,  clicos o gases.  Beber abundante agua. El [place] a Economist. Qu alimentos puedo comer? Cereales Panes integrales. Cereal multigrano. Avena. Arroz integral. Dwyane Luo. Trigo burgol. Mijo. Magdalenas de salvado. Palomitas de maz. Galletas de centeno. Verduras Batatas. [place]spin[place]. Col rizada. [place]ofas. Repollo. Brcoli. Guisantes. Zanahorias. [place]. Lambert Mody Bayas. Peras. Manzanas. Naranjas. Aguacates. Ciruelas y pasas. Higos secos. Carnes y otras fuentes de protenas Frijoles blancos, colorados, pintos y porotos de soja. Guisantes secos. Lentejas. Frutos secos y semillas. Lcteos Yogur fortificado con Pharmacist, hospital. Bebidas Leche de soja fortificada con Fredderick [place]ix. Jugo de naranja fortificado con Fredderick Phenix. Otros Barras de [place]. Es posible que los productos que se enumeran ms arriba no sean una lista completa de las bebidas o los alimentos recomendados. Comunquese con el nutricionista para conocer ms opciones. Qu alimentos no se recomiendan? Cereales Pan blanco. Pastas hechas con Letitia Neri. Arroz blanco. Verduras Papas fritas. Verduras enlatadas. Verduras muy cocidas. Frutas Jugo de frutas. Frutas cocidas coladas. Carnes y otras fuentes de protenas Cortes de carne con alto contenido de Lobbyist. Aves o pescados fritos. Lcteos Leche. Yogur. Queso crema. [employer]. Bebidas Gaseosas. Otros Tortas y pasteles. [place] y aceites. Es posible que los productos que se enumeran ms arriba no sean una lista completa de los alimentos y las bebidas que se Higher education careers adviser. Comunquese con el nutricionista para obtener ms informacin. Cules son algunos consejos para incluir alimentos ricos en fibra en la dieta?  Consuma una gran variedad de alimentos ricos en fibra.  Asegrese de que la mitad de todos los cereales consumidos cada da sean cereales integrales.  Reemplace los panes y cereales hechos de [place]  o harina blanca por panes y cereales integrales.  Reemplace  el arroz blanco por arroz integral, trigo burgol o mijo.  Comience Games developer con un desayuno rico en [place], como un cereal que contenga al menos 5gramos de fibra por porcin.  Use guisantes en lugar de carne en las sopas, ensaladas o pastas.  Coma refrigerios ricos en fibra, como frutos rojos, verduras crudas, frutos secos o palomitas de maz. Esta informacin no tiene Marine scientist el consejo del mdico. Asegrese de hacerle al mdico cualquier pregunta que tenga. Document Released: 04/16/2005 Document Revised: 08/22/2016 Document Reviewed: 09/29/2013 Elsevier Interactive Patient Education  [employer].

## 2017-11-20 NOTE — Progress Notes (Signed)
Vonda Antigua [address]  [place]  [place],  [address]  Main: [phone]  Fax: [phone]   Gastroenterology Consultation  Referring Provider:     Freddy Finner, NP Primary Care Physician:  Freddy Finner, NP Primary Gastroenterologist:  Dr. Vonda Antigua Reason for Consultation:     Abdominal pain        HPI:    Chief Complaint  Patient presents with  . Dysphagia    Referral from Billy Coast, NP    Jessica Fischer is a 57 y.o. y/o female referred for consultation & management  by Dr. Freddy Finner, NP.  Spanish interpreter was used during entire evaluation.  Patient reports right mid to right lower quadrant abdominal pain, chronic For 1 year, cramping, with no radiation, worsening with meals, with no nausea or vomiting, no blood in stool.  No weight loss.  Also reports intermittent dysphagia, but no history of food impactions.  Patient had an abdominal ultrasound in December 2013 indication was abdominal pain, and was reported to be unremarkable.  When I specifically asked about any life stressors, patient became teary, and does states she has some depression and has talked to her primary care provider about it.    She thinks she had an EGD and colonoscopy At [facility] about 4 to 5 years ago states it was normal.. I am unable to find any such records in epic, media, care everywhere, or provation.  Her primary care office note states patient is due for colorectal cancer screening.  Past Medical History:  Diagnosis Date  . Anxiety   . Asthma    NO INHALERS    Past Surgical History:  Procedure Laterality Date  . BACK SURGERY     X 2, [place], [place]  . LYMPH NODE BIOPSY Left 04/25/2016   Procedure: LYMPH NODE BIOPSY;  Surgeon: Clyde Canterbury, MD;  Location: [facility] ORS;  Service: ENT;  Laterality: Left;    Prior to Admission medications   Medication Sig Start Date End Date Taking? Authorizing Provider  acetaminophen (TYLENOL) 500 MG tablet Take 500 mg by  mouth every 6 (six) hours as needed for headache.   Yes [provider]  Multiple Vitamin (MULTIVITAMIN WITH MINERALS) TABS tablet Take 1 tablet by mouth daily.   Yes [provider]  doxycycline (VIBRAMYCIN) 100 MG capsule 100 mg PO BID x 10 days Patient not taking: Reported on 11/19/2017 04/25/16   Clyde Canterbury, MD  HYDROcodone-acetaminophen (NORCO/VICODIN) 5-325 MG tablet Take 1-2 tablets by mouth every 4 (four) hours as needed for moderate pain. Patient not taking: Reported on 11/19/2017 04/25/16   Clyde Canterbury, MD  sertraline (ZOLOFT) 50 MG tablet Take 50 mg by mouth daily.    [provider]    Family History  Problem Relation Age of Onset  . Hypertension Mother   . Hypertension Father   . Hypertension Brother      Social History   Tobacco Use  . Smoking status: Never Smoker  . Smokeless tobacco: Never Used  Substance Use Topics  . Alcohol use: No  . Drug use: No    Allergies as of 11/19/2017 - Review Complete 11/19/2017  Allergen Reaction Noted  . Penicillins Rash 04/25/2016    Review of Systems:    All systems reviewed and negative except where noted in HPI.   Physical Exam:  BP 122/82   Pulse 80   Ht 5\' 4"  (1.626 m)   BMI 29.70 kg/m  No LMP recorded. Psych:  Alert and cooperative. Normal mood and affect. General:   Alert,  Well-developed, well-nourished, pleasant and cooperative in NAD Head:  Normocephalic and atraumatic. Eyes:  Sclera clear, no icterus.   Conjunctiva pink. Ears:  Normal auditory acuity. Nose:  No deformity, discharge, or lesions. Mouth:  No deformity or lesions,oropharynx pink & moist. Neck:  Supple; no masses or thyromegaly. Lungs:  Respirations even and unlabored.  Clear throughout to auscultation.   No wheezes, crackles, or rhonchi. No acute distress. Heart:  Regular rate and rhythm; no murmurs, clicks, rubs, or gallops. Abdomen:  Normal bowel sounds.  No bruits.  Soft, non-tender and non-distended without  masses, hepatosplenomegaly or hernias noted.  No guarding or rebound tenderness.    Msk:  Symmetrical without gross deformities. Good, equal movement & strength bilaterally. Pulses:  Normal pulses noted. Extremities:  No clubbing or edema.  No cyanosis. Neurologic:  Alert and oriented x3;  grossly normal neurologically. Skin:  Intact without significant lesions or rashes. No jaundice. Lymph Nodes:  No significant cervical adenopathy. Psych:  Alert and cooperative. Normal mood and affect.   Labs: CBC    Component Value Date/Time   WBC 7.5 04/10/2016 1452   RBC 4.34 04/10/2016 1452   HGB 13.4 04/10/2016 1452   HCT 39.7 04/10/2016 1452   PLT 286 04/10/2016 1452   MCV 91.5 04/10/2016 1452   MCH 30.8 04/10/2016 1452   MCHC 33.7 04/10/2016 1452   RDW 13.4 04/10/2016 1452   LYMPHSABS 3.7 (H) 04/10/2016 1452   MONOABS 0.6 04/10/2016 1452   EOSABS 0.2 04/10/2016 1452   BASOSABS 0.0 04/10/2016 1452   CMP  No results found for: NA, K, CL, CO2, GLUCOSE, BUN, CREATININE, CALCIUM, PROT, ALBUMIN, AST, ALT, ALKPHOS, BILITOT, GFRNONAA, GFRAA  Imaging Studies: No results found.  Assessment and Plan:   Jessica Fischer is a 57 y.o. y/o female has been referred for chronic abdominal pain, also reporting intermittent dysphagia  EGD indicated due to intermittent dysphagia, to rule out eosinophilic esophagitis and any strictures or narrowing We will also obtain biopsies for H. pylori due to complaints of abdominal pain  She reports taking Metamucil, as cannot go to the bathroom without it High-fiber diet encouraged as well  I have asked Her to stop this and start taking MiraLAX, to allow her to have more regular and softer bowel movements daily as I can to help with her abdominal pain  Primary care doctor note reports that she is due for colorectal cancer screening.  I do not see any previous records of colonoscopies in her chart.  We will schedule CRC screening along with EGD  I have discussed  alternative options, risks & benefits,  which include, but are not limited to, bleeding, infection, perforation,respiratory complication & drug reaction.  The patient agrees with this plan & written consent will be obtained.      Dr Vonda Antigua

## 2017-12-05 ENCOUNTER — Ambulatory Visit
Admission: RE | Admit: 2017-12-05 | Discharge: 2017-12-05 | Disposition: A | Discharge status: Home / Self Care | Payer: BLUE CROSS/BLUE SHIELD | Source: Ambulatory Visit | Attending: Primary Care | Admitting: Primary Care

## 2017-12-05 ENCOUNTER — Encounter (HOSPITAL_COMMUNITY): Payer: Self-pay

## 2017-12-05 DIAGNOSIS — Z1231 Encounter for screening mammogram for malignant neoplasm of breast: Secondary | ICD-10-CM | POA: Diagnosis present

## 2017-12-11 ENCOUNTER — Other Ambulatory Visit: Payer: Self-pay

## 2017-12-11 ENCOUNTER — Telehealth: Payer: Self-pay

## 2017-12-11 DIAGNOSIS — Z1211 Encounter for screening for malignant neoplasm of colon: Secondary | ICD-10-CM

## 2017-12-11 MED ORDER — NA SULFATE-K SULFATE-MG SULF 17.5-3.13-1.6 GM/177ML PO SOLN: 1.0000 | Freq: Once | ORAL | 0 refills | Status: AC

## 2017-12-11 NOTE — Telephone Encounter (Signed)
-----   Message from Virgel Manifold, MD sent at 11/20/2017  2:02 PM EDT ----- Jackelyn Poling, please call the patient and tell her we are unable to find any records of her previous colonoscopies in her chart.  Her primary care doctor wrote in her chart that she is due for colonoscopy screening.  If it is okay with her, we can schedule this along with her EGD

## 2017-12-11 NOTE — Telephone Encounter (Signed)
Lmtco

## 2017-12-13 NOTE — Telephone Encounter (Signed)
Pt with interpretor calls and schedules colonoscopy with EGD.

## 2017-12-20 ENCOUNTER — Encounter: Payer: Self-pay | Admitting: *Deleted

## 2017-12-20 ENCOUNTER — Ambulatory Visit: Payer: BLUE CROSS/BLUE SHIELD | Admitting: Registered Nurse

## 2017-12-20 ENCOUNTER — Ambulatory Visit
Admission: RE | Admit: 2017-12-20 | Discharge: 2017-12-20 | Disposition: A | Discharge status: Home / Self Care | Payer: BLUE CROSS/BLUE SHIELD | Source: Ambulatory Visit | Attending: Gastroenterology | Admitting: Gastroenterology

## 2017-12-20 ENCOUNTER — Encounter
Admission: RE | Disposition: A | Discharge status: Home / Self Care | Payer: Self-pay | Source: Ambulatory Visit | Attending: Gastroenterology

## 2017-12-20 DIAGNOSIS — F419 Anxiety disorder, unspecified: Secondary | ICD-10-CM | POA: Insufficient documentation

## 2017-12-20 DIAGNOSIS — Z79899 Other long term (current) drug therapy: Secondary | ICD-10-CM | POA: Diagnosis not present

## 2017-12-20 DIAGNOSIS — R1310 Dysphagia, unspecified: Secondary | ICD-10-CM | POA: Insufficient documentation

## 2017-12-20 DIAGNOSIS — K228 Other specified diseases of esophagus: Secondary | ICD-10-CM | POA: Diagnosis not present

## 2017-12-20 DIAGNOSIS — J45909 Unspecified asthma, uncomplicated: Secondary | ICD-10-CM | POA: Diagnosis not present

## 2017-12-20 DIAGNOSIS — Z1211 Encounter for screening for malignant neoplasm of colon: Secondary | ICD-10-CM | POA: Diagnosis present

## 2017-12-20 DIAGNOSIS — Z880 Allergy status to penicillin: Secondary | ICD-10-CM | POA: Diagnosis not present

## 2017-12-20 DIAGNOSIS — K2289 Other specified disease of esophagus: Secondary | ICD-10-CM

## 2017-12-20 HISTORY — PX: ESOPHAGOGASTRODUODENOSCOPY (EGD) WITH PROPOFOL: SHX5813

## 2017-12-20 HISTORY — PX: COLONOSCOPY WITH PROPOFOL: SHX5780

## 2017-12-20 LAB — POCT PREGNANCY, URINE: Preg Test, Ur: NEGATIVE

## 2017-12-20 SURGERY — ESOPHAGOGASTRODUODENOSCOPY (EGD) WITH PROPOFOL
Anesthesia: General

## 2017-12-20 MED ORDER — SUCCINYLCHOLINE CHLORIDE 20 MG/ML IJ SOLN
INTRAMUSCULAR | Status: AC
  Filled 2017-12-20: qty 1

## 2017-12-20 MED ORDER — PROPOFOL 10 MG/ML IV BOLUS
INTRAVENOUS | Status: DC | PRN
  Administered 2017-12-20: 80 mg via INTRAVENOUS

## 2017-12-20 MED ORDER — PROPOFOL 500 MG/50ML IV EMUL
INTRAVENOUS | Status: AC
  Filled 2017-12-20: qty 50

## 2017-12-20 MED ORDER — MIDAZOLAM HCL 2 MG/2ML IJ SOLN
INTRAMUSCULAR | Status: DC | PRN
  Administered 2017-12-20: 2 mg via INTRAVENOUS

## 2017-12-20 MED ORDER — MIDAZOLAM HCL 2 MG/2ML IJ SOLN
INTRAMUSCULAR | Status: AC
  Filled 2017-12-20: qty 2

## 2017-12-20 MED ORDER — GLYCOPYRROLATE 0.2 MG/ML IJ SOLN
INTRAMUSCULAR | Status: AC
  Filled 2017-12-20: qty 1

## 2017-12-20 MED ORDER — SODIUM CHLORIDE 0.9 % IV SOLN
INTRAVENOUS | Status: DC
  Administered 2017-12-20: 08:00:00 via INTRAVENOUS

## 2017-12-20 MED ORDER — GLYCOPYRROLATE 0.2 MG/ML IJ SOLN
INTRAMUSCULAR | Status: DC | PRN
  Administered 2017-12-20: 0.2 mg via INTRAVENOUS

## 2017-12-20 MED ORDER — PHENYLEPHRINE HCL 10 MG/ML IJ SOLN
INTRAMUSCULAR | Status: DC | PRN
  Administered 2017-12-20 (×3): 100 ug via INTRAVENOUS

## 2017-12-20 MED ORDER — PHENYLEPHRINE HCL 10 MG/ML IJ SOLN
INTRAMUSCULAR | Status: AC
  Filled 2017-12-20: qty 1

## 2017-12-20 MED ORDER — PROPOFOL 500 MG/50ML IV EMUL
INTRAVENOUS | Status: DC | PRN
  Administered 2017-12-20: 150 ug/kg/min via INTRAVENOUS

## 2017-12-20 NOTE — Op Note (Addendum)
[facility] Gastroenterology Patient Name: Jessica Fischer Procedure Date: 12/20/2017 7:34 AM MRN: [id number] Account #: [id number] Date of Birth: [birth year] Admit Type: Outpatient Age: 57 Room: [facility] ENDO ROOM 2 Gender: Female Note Status: Finalized Procedure:            Upper GI endoscopy Indications:          Dysphagia Providers:             B. Bonna Gains MD, MD Referring MD:         Neomia Dear (Referring MD) Medicines:            Monitored Anesthesia Care Complications:        No immediate complications. Procedure:            Pre-Anesthesia Assessment:                       - The risks and benefits of the procedure and the                        sedation options and risks were discussed with the                        patient. All questions were answered and informed                        consent was obtained.                       - Patient identification and proposed procedure were                        verified prior to the procedure.                       - ASA Grade Assessment: II - A patient with mild                        systemic disease.                       After obtaining informed consent, the endoscope was                        passed under direct vision. Throughout the procedure,                        the patient's blood pressure, pulse, and oxygen                        saturations were monitored continuously. The Endoscope                        was introduced through the mouth, and advanced to the                        second part of duodenum. The upper GI endoscopy was                        accomplished with ease. The patient tolerated the  procedure well. Findings:      The Z-line was irregular and was found 39 cm from the incisors. Mucosa       was biopsied with a cold forceps for histology in 4 quadrants at the       gastroesophageal junction. Biopsies were obtained from the proximal and       distal  esophagus with cold forceps for histology of suspected       eosinophilic esophagitis.      The entire examined stomach was normal. Biopsies were obtained in the       gastric body, at the incisura and in the gastric antrum with cold       forceps for histology.      The in the duodenum was normal. Impression:           - Z-line irregular, 39 cm from the incisors. Biopsied.                       - Normal stomach.                       - Normal.                       - Biopsies were obtained in the gastric body, at the                        incisura and in the gastric antrum. Recommendation:       - Await pathology results.                       - Discharge patient to home.                       - Continue present medications.                       - Advance diet as tolerated.                       - The findings and recommendations were discussed with                        the patient. Procedure Code(s):    --- Professional ---                       [phone], Esophagogastroduodenoscopy, flexible, transoral;                        with biopsy, single or multiple Diagnosis Code(s):    --- Professional ---                       K22.8, Other specified diseases of esophagus                       R13.10, Dysphagia, unspecified CPT copyright 2017 American Medical Association. All rights reserved. The codes documented in this report are preliminary and upon coder review may  be revised to meet current compliance requirements.  Vonda Antigua, MD Margretta Sidle B. Bonna Gains MD, MD 12/20/2017 8:40:14 AM This report has been signed electronically. Number of Addenda: 0 Note Initiated On: 12/20/2017 7:34 AM Estimated Blood Loss: Estimated blood loss: none.  [facility]

## 2017-12-20 NOTE — H&P (Signed)
Vonda Antigua, MD [address] [place], [place], [place], [address] [address], [place], [place], [place], [address] Phone: [phone]  Fax: [phone]  Primary Care Physician:  Freddy Finner, NP   Pre-Procedure History & Physical: HPI:  Jessica Fischer is a 57 y.o. female is here for a colonoscopy and EGD.   Past Medical History:  Diagnosis Date  . Anxiety   . Asthma    NO INHALERS    Past Surgical History:  Procedure Laterality Date  . BACK SURGERY     X 2, [place], [place]  . LYMPH NODE BIOPSY Left 04/25/2016   Procedure: LYMPH NODE BIOPSY;  Surgeon: Clyde Canterbury, MD;  Location: [facility] ORS;  Service: ENT;  Laterality: Left;    Prior to Admission medications   Medication Sig Start Date End Date Taking? Authorizing Provider  acetaminophen (TYLENOL) 500 MG tablet Take 500 mg by mouth every 6 (six) hours as needed for headache.    [provider]  doxycycline (VIBRAMYCIN) 100 MG capsule 100 mg PO BID x 10 days Patient not taking: Reported on 11/19/2017 04/25/16   Clyde Canterbury, MD  HYDROcodone-acetaminophen (NORCO/VICODIN) 5-325 MG tablet Take 1-2 tablets by mouth every 4 (four) hours as needed for moderate pain. Patient not taking: Reported on 11/19/2017 04/25/16   Clyde Canterbury, MD  Multiple Vitamin (MULTIVITAMIN WITH MINERALS) TABS tablet Take 1 tablet by mouth daily.    [provider]  sertraline (ZOLOFT) 50 MG tablet Take 50 mg by mouth daily.    [provider]    Allergies as of 11/20/2017 - Review Complete 11/19/2017  Allergen Reaction Noted  . Penicillins Rash 04/25/2016    Family History  Problem Relation Age of Onset  . Hypertension Mother   . Hypertension Father   . Hypertension Brother   . Breast cancer Neg Hx     Social History   Socioeconomic History  . Marital status: Married    Spouse name: Not on file  . Number of children: Not on file  . Years of education: Not on file  . Highest education level: Not on  file  Occupational History  . Not on file  Social Needs  . Financial resource strain: Not on file  . Food insecurity:    Worry: Not on file    Inability: Not on file  . Transportation needs:    Medical: Not on file    Non-medical: Not on file  Tobacco Use  . Smoking status: Never Smoker  . Smokeless tobacco: Never Used  Substance and Sexual Activity  . Alcohol use: No  . Drug use: No  . Sexual activity: Not on file  Lifestyle  . Physical activity:    Days per week: Not on file    Minutes per session: Not on file  . Stress: Not on file  Relationships  . Social connections:    Talks on phone: Not on file    Gets together: Not on file    Attends religious service: Not on file    Active member of club or organization: Not on file    Attends meetings of clubs or organizations: Not on file    Relationship status: Not on file  . Intimate partner violence:    Fear of current or ex partner: Not on file    Emotionally abused: Not on file    Physically abused: Not on file    Forced sexual activity: Not on file  Other Topics Concern  . Not on  file  Social History Narrative  . Not on file    Review of Systems: See HPI, otherwise negative ROS  Physical Exam: There were no vitals taken for this visit. General:   Alert,  pleasant and cooperative in NAD Head:  Normocephalic and atraumatic. Neck:  Supple; no masses or thyromegaly. Lungs:  Clear throughout to auscultation, normal respiratory effort.    Heart:  +S1, +S2, Regular rate and rhythm, No edema. Abdomen:  Soft, nontender and nondistended. Normal bowel sounds, without guarding, and without rebound.   Neurologic:  Alert and  oriented x4;  grossly normal neurologically.  Impression/Plan: Jessica Fischer is here for a colonoscopy to be performed for average risk screening and EGD for dysphagia.  Risks, benefits, limitations, and alternatives regarding the procedures have been reviewed with the patient.  Questions have been  answered.  All parties agreeable.   Virgel Manifold, MD  12/20/2017, 8:02 AM

## 2017-12-20 NOTE — Anesthesia Post-op Follow-up Note (Signed)
Anesthesia QCDR form completed.        

## 2017-12-20 NOTE — Anesthesia Postprocedure Evaluation (Signed)
Anesthesia Post Note  Patient: SHRILEY JOFFE  Procedure(s) Performed: ESOPHAGOGASTRODUODENOSCOPY (EGD) WITH PROPOFOL (N/A ) COLONOSCOPY WITH PROPOFOL (N/A )  Patient location during evaluation: PACU Anesthesia Type: General Level of consciousness: awake and alert Pain management: pain level controlled Vital Signs Assessment: post-procedure vital signs reviewed and stable Respiratory status: spontaneous breathing, nonlabored ventilation, respiratory function stable and patient connected to nasal cannula oxygen Cardiovascular status: blood pressure returned to baseline and stable Postop Assessment: no apparent nausea or vomiting Anesthetic complications: no     Last Vitals:  Vitals:   12/20/17 0911 12/20/17 0942  BP: 93/60 116/88  Pulse: 71   Resp: 20   Temp: (!) 36.1 C   SpO2: 99%     Last Pain:  Vitals:   12/20/17 0911  TempSrc: Tympanic  PainSc: 0-No pain                 Molli Barrows

## 2017-12-20 NOTE — Transfer of Care (Signed)
Immediate Anesthesia Transfer of Care Note  Patient: Jessica Fischer  Procedure(s) Performed: Procedure(s): ESOPHAGOGASTRODUODENOSCOPY (EGD) WITH PROPOFOL (N/A) COLONOSCOPY WITH PROPOFOL (N/A)  Patient Location: PACU and Endoscopy Unit  Anesthesia Type:General  Level of Consciousness: sedated  Airway & Oxygen Therapy: Patient Spontanous Breathing and Patient connected to nasal cannula oxygen  Post-op Assessment: Report given to RN and Post -op Vital signs reviewed and stable  Post vital signs: Reviewed and stable  Last Vitals:  Vitals:   12/20/17 0911  BP: 93/60  Pulse: 71  Resp: 20  Temp: (!) 36.1 C  SpO2: 61%    Complications: No apparent anesthesia complications

## 2017-12-20 NOTE — Op Note (Signed)
[facility] Gastroenterology Patient Name: Jessica Fischer Procedure Date: 12/20/2017 7:32 AM MRN: [id number] Account #: [id number] Date of Birth: 08-29-1960 Admit Type: Outpatient Age: 57 Room: [facility] ENDO ROOM 2 Gender: Female Note Status: Finalized Procedure:            Colonoscopy Indications:          Screening for colorectal malignant neoplasm Providers:             B. Bonna Gains MD, MD Medicines:            Monitored Anesthesia Care Complications:        No immediate complications. Procedure:            Pre-Anesthesia Assessment:                       - Prior to the procedure, a History and Physical was                        performed, and patient medications, allergies and                        sensitivities were reviewed. The patient's tolerance of                        previous anesthesia was reviewed.                       - The risks and benefits of the procedure and the                        sedation options and risks were discussed with the                        patient. All questions were answered and informed                        consent was obtained.                       - Patient identification and proposed procedure were                        verified prior to the procedure by the physician, the                        nurse, the anesthetist and the technician. The                        procedure was verified in the pre-procedure area in the                        procedure room in the endoscopy suite.                       - ASA Grade Assessment: II - A patient with mild                        systemic disease.                       - After reviewing the risks and benefits, the patient  was deemed in satisfactory condition to undergo the                        procedure.                       After obtaining informed consent, the colonoscope was                        passed under direct vision. Throughout the  procedure,                        the patient's blood pressure, pulse, and oxygen                        saturations were monitored continuously. The                        Colonoscope was introduced through the anus and                        advanced to the the cecum, identified by appendiceal                        orifice and ileocecal valve. The colonoscopy was                        performed with ease. The patient tolerated the                        procedure well. The quality of the bowel preparation                        was good. Findings:      The perianal and digital rectal examinations were normal.      The rectum, sigmoid colon, descending colon, transverse colon, ascending       colon and cecum appeared normal.      The retroflexed view of the distal rectum and anal verge was normal and       showed no anal or rectal abnormalities. Impression:           - The rectum, sigmoid colon, descending colon,                        transverse colon, ascending colon and cecum are normal.                       - The distal rectum and anal verge are normal on                        retroflexion view.                       - No specimens collected. Recommendation:       - Discharge patient to home.                       - Resume previous diet.                       - Continue present medications.                       -  Repeat colonoscopy in 10 years for screening purposes.                       - Return to primary care physician as previously                        scheduled.                       - The findings and recommendations were discussed with                        the patient.                       - The findings and recommendations were discussed with                        the patient's family.                       - In the future, if patient develops new symptoms such                        as blood per rectum, abdominal pain, weight loss,                         altered bowel habits or any other reason for concern,                        patient should discuss this with her PCP as she may                        need a GI referral at that time or evaluation for need                        for colonoscopy earlier than her recommended screening                        colonoscopy.                       In addition, if patient's family history of colon                        cancer changes (no family history at this time) in the                        future, earlier screening may be indicated and patient                        should discuss this with PCP as well. Procedure Code(s):    --- Professional ---                       Q1194, Colorectal cancer screening; colonoscopy on                        individual not meeting criteria for high risk Diagnosis Code(s):    --- Professional ---  Z12.11, Encounter for screening for malignant neoplasm                        of colon CPT copyright 2017 American Medical Association. All rights reserved. The codes documented in this report are preliminary and upon coder review may  be revised to meet current compliance requirements.  Vonda Antigua, MD Margretta Sidle B. Bonna Gains MD, MD 12/20/2017 9:06:47 AM This report has been signed electronically. Number of Addenda: 0 Note Initiated On: 12/20/2017 7:32 AM Scope Withdrawal Time: 0 hours 15 minutes 2 seconds  Total Procedure Duration: 0 hours 20 minutes 15 seconds       [facility]

## 2017-12-20 NOTE — Anesthesia Preprocedure Evaluation (Signed)
Anesthesia Evaluation  Patient identified by MRN, date of birth, ID band Patient awake    Reviewed: Allergy & Precautions, H&P , NPO status , Patient's Chart, lab work & pertinent test results, reviewed documented beta blocker date and time   Airway Mallampati: II   Neck ROM: full    Dental  (+) Poor Dentition   Pulmonary neg pulmonary ROS, asthma ,    Pulmonary exam normal        Cardiovascular Exercise Tolerance: Good negative cardio ROS Normal cardiovascular exam Rhythm:regular Rate:Normal     Neuro/Psych Anxiety negative neurological ROS  negative psych ROS   GI/Hepatic negative GI ROS, Neg liver ROS,   Endo/Other  negative endocrine ROS  Renal/GU negative Renal ROS  negative genitourinary   Musculoskeletal   Abdominal   Peds  Hematology negative hematology ROS (+)   Anesthesia Other Findings Past Medical History: No date: Anxiety No date: Asthma     Comment:  NO INHALERS Past Surgical History: No date: BACK SURGERY     Comment:  X 2, [place], [place] 04/25/2016: LYMPH NODE BIOPSY; Left     Comment:  Procedure: LYMPH NODE BIOPSY;  Surgeon: Clyde Canterbury,               MD;  Location: [facility] ORS;  Service: ENT;  Laterality:               Left;   Reproductive/Obstetrics negative OB ROS                             Anesthesia Physical Anesthesia Plan  ASA: III  Anesthesia Plan: General   Post-op Pain Management:    Induction:   PONV Risk Score and Plan:   Airway Management Planned:   Additional Equipment:   Intra-op Plan:   Post-operative Plan:   Informed Consent: I have reviewed the patients History and Physical, chart, labs and discussed the procedure including the risks, benefits and alternatives for the proposed anesthesia with the patient or authorized representative who has indicated his/her understanding and acceptance.   Dental Advisory Given  Plan Discussed  with: CRNA  Anesthesia Plan Comments:         Anesthesia Quick Evaluation

## 2017-12-21 ENCOUNTER — Encounter: Payer: Self-pay | Admitting: Gastroenterology

## 2017-12-25 LAB — SURGICAL PATHOLOGY

## 2018-01-07 ENCOUNTER — Telehealth: Payer: Self-pay | Admitting: Gastroenterology

## 2018-01-07 NOTE — Telephone Encounter (Signed)
Pt is returning a call  

## 2018-01-17 ENCOUNTER — Telehealth: Payer: Self-pay

## 2018-01-17 NOTE — Telephone Encounter (Signed)
Unable to speak to patient but left her a detailed message regarding her results using [employer]. Interpreters name was Gerald Stabs ID # [id number]  Thanks Sharyn Lull

## 2018-01-17 NOTE — Telephone Encounter (Signed)
-----   Message from Virgel Manifold, MD sent at 01/06/2018  3:34 PM EDT ----- Jackelyn Poling please let patient know, her biopsies did not show any infection from H. pylori.  If she still having symptoms, please prescribe omeprazole 20 mg 3 minutes before breakfast daily.  Follow-up in clinic as scheduled.

## 2018-02-19 ENCOUNTER — Ambulatory Visit: Payer: BLUE CROSS/BLUE SHIELD | Admitting: Gastroenterology

## 2019-01-07 ENCOUNTER — Other Ambulatory Visit: Payer: Self-pay | Admitting: Primary Care

## 2019-01-29 ENCOUNTER — Other Ambulatory Visit: Payer: Self-pay | Admitting: Primary Care

## 2019-01-29 DIAGNOSIS — Z1231 Encounter for screening mammogram for malignant neoplasm of breast: Secondary | ICD-10-CM

## 2019-04-01 ENCOUNTER — Ambulatory Visit
Admission: RE | Admit: 2019-04-01 | Discharge: 2019-04-01 | Disposition: A | Discharge status: Home / Self Care | Payer: BC Managed Care – PPO | Source: Ambulatory Visit | Attending: Primary Care | Admitting: Primary Care

## 2019-04-01 DIAGNOSIS — Z1231 Encounter for screening mammogram for malignant neoplasm of breast: Secondary | ICD-10-CM | POA: Insufficient documentation

## 2019-04-09 ENCOUNTER — Other Ambulatory Visit: Payer: Self-pay | Admitting: Primary Care

## 2019-04-09 DIAGNOSIS — R928 Other abnormal and inconclusive findings on diagnostic imaging of breast: Secondary | ICD-10-CM

## 2019-04-17 ENCOUNTER — Ambulatory Visit
Admission: RE | Admit: 2019-04-17 | Discharge: 2019-04-17 | Disposition: A | Discharge status: Home / Self Care | Payer: BC Managed Care – PPO | Source: Ambulatory Visit | Attending: Primary Care | Admitting: Primary Care

## 2019-04-17 ENCOUNTER — Other Ambulatory Visit: Payer: Self-pay

## 2019-04-17 DIAGNOSIS — R928 Other abnormal and inconclusive findings on diagnostic imaging of breast: Secondary | ICD-10-CM

## 2021-02-22 ENCOUNTER — Other Ambulatory Visit: Payer: Self-pay | Admitting: Primary Care

## 2021-02-22 DIAGNOSIS — Z1231 Encounter for screening mammogram for malignant neoplasm of breast: Secondary | ICD-10-CM

## 2021-04-04 ENCOUNTER — Other Ambulatory Visit: Payer: Self-pay

## 2021-04-04 ENCOUNTER — Ambulatory Visit
Admission: RE | Admit: 2021-04-04 | Discharge: 2021-04-04 | Disposition: A | Discharge status: Home / Self Care | Payer: BC Managed Care – PPO | Source: Ambulatory Visit | Attending: Primary Care | Admitting: Primary Care

## 2021-04-04 DIAGNOSIS — Z1231 Encounter for screening mammogram for malignant neoplasm of breast: Secondary | ICD-10-CM | POA: Diagnosis present

## 2021-08-16 ENCOUNTER — Ambulatory Visit (INDEPENDENT_AMBULATORY_CARE_PROVIDER_SITE_OTHER): Payer: BC Managed Care – PPO | Admitting: Obstetrics and Gynecology

## 2021-08-16 ENCOUNTER — Encounter: Payer: Self-pay | Admitting: Obstetrics and Gynecology

## 2021-08-16 VITALS — BP 147/96 | HR 84 | Ht 64.0 in | Wt 163.4 lb

## 2021-08-16 DIAGNOSIS — Z7689 Persons encountering health services in other specified circumstances: Secondary | ICD-10-CM | POA: Diagnosis not present

## 2021-08-16 DIAGNOSIS — R102 Pelvic and perineal pain: Secondary | ICD-10-CM

## 2021-08-16 DIAGNOSIS — N950 Postmenopausal bleeding: Secondary | ICD-10-CM

## 2021-08-16 NOTE — Progress Notes (Signed)
HPI: ?     [name] is a 61 y.o. [phone] who LMP was Patient's last menstrual period was 02/24/2016. ? ?Subjective:  ? ?She presents today because 6 years ago she went into menopause and approximately 2 months ago she had a week of vaginal bleeding.  She has had no bleeding since that time. ?She states that she had an endometrial biopsy performed and reports it is negative. (I do not have these results and cannot see them but she is very credible in her description of the findings.) ?In addition upon further questioning I found that she recently received a steroid injection immediately prior to her bleeding. ?Her secondary issue is that she has noticed an increase in pelvic pressure and cramping.  This has been present for about the same amount of time since the bleeding.  It is not disabling.  It is not caused or fixed by anything in particular. ? ?  Hx: ?The following portions of the patient's history were reviewed and updated as appropriate: ?            She  has a past medical history of Anxiety and Asthma. ?She does not have any pertinent problems on file. ?She  has a past surgical history that includes Back surgery; Lymph node biopsy (Left, 04/25/2016); Esophagogastroduodenoscopy (egd) with propofol (N/A, 12/20/2017); and Colonoscopy with propofol (N/A, 12/20/2017). ?Her family history includes Hypertension in her brother, father, and mother. ?She  reports that she has never smoked. She has never used smokeless tobacco. She reports that she does not drink alcohol and does not use drugs. ?She has a current medication list which includes the following prescription(s): acetaminophen, fluoxetine, multivitamin with minerals, and pantoprazole. ?She is allergic to penicillins. ?      ?Review of Systems:  ?Review of Systems ? ?Constitutional: Denied constitutional symptoms, night sweats, recent illness, fatigue, fever, insomnia and weight loss.  ?Eyes: Denied eye symptoms, eye pain, photophobia, vision change  and visual disturbance.  ?Ears/Nose/Throat/Neck: Denied ear, nose, throat or neck symptoms, hearing loss, nasal discharge, sinus congestion and sore throat.  ?Cardiovascular: Denied cardiovascular symptoms, arrhythmia, chest pain/pressure, edema, exercise intolerance, orthopnea and palpitations.  ?Respiratory: Denied pulmonary symptoms, asthma, pleuritic pain, productive sputum, cough, dyspnea and wheezing.  ?Gastrointestinal: Denied, gastro-esophageal reflux, melena, nausea and vomiting.  ?Genitourinary: See HPI for additional information.  ?Musculoskeletal: Denied musculoskeletal symptoms, stiffness, swelling, muscle weakness and myalgia.  ?Dermatologic: Denied dermatology symptoms, rash and scar.  ?Neurologic: Denied neurology symptoms, dizziness, headache, neck pain and syncope.  ?Psychiatric: Denied psychiatric symptoms, anxiety and depression.  ?Endocrine: Denied endocrine symptoms including hot flashes and night sweats.  ? ?Meds: ?  ?Current Outpatient Medications on File Prior to Visit  ?Medication Sig Dispense Refill  ? acetaminophen (TYLENOL) 500 MG tablet Take 500 mg by mouth every 6 (six) hours as needed for headache.    ? FLUoxetine (PROZAC) 20 MG tablet Take 20 mg by mouth daily.    ? Multiple Vitamin (MULTIVITAMIN WITH MINERALS) TABS tablet Take 1 tablet by mouth daily.    ? pantoprazole (PROTONIX) 40 MG tablet Take 40 mg by mouth daily.    ? ?No current facility-administered medications on file prior to visit.  ? ? ? ? ?Objective:  ?  ? ?Vitals:  ? 08/16/21 1450 08/16/21 1454  ?BP: (!) 159/101 (!) 147/96  ?Pulse: 82 84  ? ?Filed Weights  ? 08/16/21 1450  ?Weight: 163 lb 6.4 oz (74.1 kg)  ? ?  ?          ?        ? ?  Assessment:  ?  ?[id number] ?Patient Active Problem List  ? Diagnosis Date Noted  ? Dysphagia   ? CLE (columnar lined esophagus)   ? Special screening for malignant neoplasms, colon   ? ?  ?1. Post-menopausal bleeding   ?2. Establishing care with new doctor, encounter for   ?3. Pelvic  cramping   ? ? With a negative endometrial biopsy as the patient describes we have ruled out hyperplasia and malignancy.  She may have uterine fibroids which may be causing her pressure and cramping symptoms. ?I think it is very likely that her steroid injection caused her to have a short episode of postmenopausal bleeding which is now resolved. ? ? ?Plan:  ?  ?       ? 1.  Pelvic ultrasound to investigate uterine fibroids, remeasure endometrial thickness and check for any other possible pelvic abnormality. ?Orders ?Orders Placed This Encounter  ?Procedures  ? US PELVIS (TRANSABDOMINAL ONLY)  ? US PELVIS TRANSVAGINAL NON-OB (TV ONLY)  ? ? No orders of the defined types were placed in this encounter. ?  ?  F/U ? Return in about 4 weeks (around 09/13/2021). ?I spent 32 minutes involved in the care of this patient preparing to see the patient by obtaining and reviewing her medical history (including labs, imaging tests and prior procedures), documenting clinical information in the electronic health record (EHR), counseling and coordinating care plans, writing and sending prescriptions, ordering tests or procedures and in direct communicating with the patient and medical staff discussing pertinent items from her history and physical exam. ? ?Finis Bud, M.D. ?08/16/2021 ?3:25 PM ? ? ? ? ?

## 2021-08-16 NOTE — Progress Notes (Signed)
Patient presents today due to postmenopausal bleeding. She states she bled for one week in January, heavy flow for two days, and has not bled since. Patient states since then she has felt cramping twice as if she were to have a period. Patient states no other concerns at this time.  ?

## 2021-09-06 ENCOUNTER — Other Ambulatory Visit: Payer: Self-pay | Admitting: Obstetrics and Gynecology

## 2021-09-06 ENCOUNTER — Ambulatory Visit (INDEPENDENT_AMBULATORY_CARE_PROVIDER_SITE_OTHER): Payer: BC Managed Care – PPO

## 2021-09-06 DIAGNOSIS — N950 Postmenopausal bleeding: Secondary | ICD-10-CM

## 2021-09-06 DIAGNOSIS — R102 Pelvic and perineal pain: Secondary | ICD-10-CM | POA: Diagnosis not present

## 2021-09-13 ENCOUNTER — Encounter: Payer: Self-pay | Admitting: Obstetrics and Gynecology

## 2021-09-13 ENCOUNTER — Ambulatory Visit: Payer: BC Managed Care – PPO | Admitting: Obstetrics and Gynecology

## 2021-09-13 VITALS — BP 144/85 | HR 89 | Ht 64.0 in | Wt 166.8 lb

## 2021-09-13 DIAGNOSIS — D219 Benign neoplasm of connective and other soft tissue, unspecified: Secondary | ICD-10-CM | POA: Diagnosis not present

## 2021-09-13 DIAGNOSIS — N950 Postmenopausal bleeding: Secondary | ICD-10-CM | POA: Diagnosis not present

## 2021-09-13 NOTE — Progress Notes (Signed)
Patient presents today for ultrasound follow-up. She states no pain or bleeding since last visit. She reports she is anxious for results. No other concerns voiced.  ?

## 2021-09-13 NOTE — Progress Notes (Signed)
HPI: ?     [name] is a 61 y.o. [phone] who LMP was Patient's last menstrual period was 02/24/2016. ? ?Subjective:  ? ?She presents today to discuss her ultrasound findings.  She was having postmenopausal bleeding for 1 episode.  She has not had any bleeding since that time.  She reports that she did have an endometrial biopsy at [facility] and that it was negative for any problems and for cancer. ?Her ultrasound reveals multiple uterine fibroids 1 of which is submucosal. ?Her endometrial thickness is 14 mm. ? ?  Hx: ?The following portions of the patient's history were reviewed and updated as appropriate: ?            She  has a past medical history of Anxiety and Asthma. ?She does not have any pertinent problems on file. ?She  has a past surgical history that includes Back surgery; Lymph node biopsy (Left, 04/25/2016); Esophagogastroduodenoscopy (egd) with propofol (N/A, 12/20/2017); and Colonoscopy with propofol (N/A, 12/20/2017). ?Her family history includes Arthritis in her brother; Hypertension in her brother, father, and mother. ?She  reports that she has never smoked. She has never used smokeless tobacco. She reports that she does not drink alcohol and does not use drugs. ?She has a current medication list which includes the following prescription(s): acetaminophen, famotidine, fluoxetine, multivitamin with minerals, and omega-3. ?She is allergic to penicillins. ?      ?Review of Systems:  ?Review of Systems ? ?Constitutional: Denied constitutional symptoms, night sweats, recent illness, fatigue, fever, insomnia and weight loss.  ?Eyes: Denied eye symptoms, eye pain, photophobia, vision change and visual disturbance.  ?Ears/Nose/Throat/Neck: Denied ear, nose, throat or neck symptoms, hearing loss, nasal discharge, sinus congestion and sore throat.  ?Cardiovascular: Denied cardiovascular symptoms, arrhythmia, chest pain/pressure, edema, exercise intolerance, orthopnea and palpitations.   ?Respiratory: Denied pulmonary symptoms, asthma, pleuritic pain, productive sputum, cough, dyspnea and wheezing.  ?Gastrointestinal: Denied, gastro-esophageal reflux, melena, nausea and vomiting.  ?Genitourinary: Denied genitourinary symptoms including symptomatic vaginal discharge, pelvic relaxation issues, and urinary complaints.  ?Musculoskeletal: Denied musculoskeletal symptoms, stiffness, swelling, muscle weakness and myalgia.  ?Dermatologic: Denied dermatology symptoms, rash and scar.  ?Neurologic: Denied neurology symptoms, dizziness, headache, neck pain and syncope.  ?Psychiatric: Denied psychiatric symptoms, anxiety and depression.  ?Endocrine: Denied endocrine symptoms including hot flashes and night sweats.  ? ?Meds: ?  ?Current Outpatient Medications on File Prior to Visit  ?Medication Sig Dispense Refill  ? acetaminophen (TYLENOL) 500 MG tablet Take 500 mg by mouth every 6 (six) hours as needed for headache.    ? famotidine (PEPCID) 20 MG tablet Take 20 mg by mouth 2 (two) times daily.    ? FLUoxetine (PROZAC) 20 MG capsule Take 20 mg by mouth daily.    ? Multiple Vitamin (MULTIVITAMIN WITH MINERALS) TABS tablet Take 1 tablet by mouth daily.    ? Omega-3 1000 MG CAPS Take by mouth.    ? ?No current facility-administered medications on file prior to visit.  ? ? ? ? ?Objective:  ?  ? ?Vitals:  ? 09/13/21 1510  ?BP: (!) 144/85  ?Pulse: 89  ? ?Filed Weights  ? 09/13/21 1510  ?Weight: 166 lb 12.8 oz (75.7 kg)  ? ?  ?          ?        ? ?Assessment:  ?  ?[id number] ?Patient Active Problem List  ? Diagnosis Date Noted  ? Dysphagia   ? CLE (columnar lined esophagus)   ?  Special screening for malignant neoplasms, colon   ? ?  ?1. Fibroids   ?2. Post-menopausal bleeding   ? ? I have discussed her ultrasound in detail with her.  I explained to her that a negative endometrial biopsy trumps an endometrial thickness by ultrasound.  She understands that if we can obtain the records from [facility] that showing  negative endometrial biopsy and she has no further postmenopausal bleeding she has no significant risk for endometrial cancer.  The ultrasound findings of a submucosal fibroid even though the patient is menopausal could explain her occasional short episodes of postmenopausal bleeding. ? ? ?Plan:  ?  ?       ? 1.  Patient to obtain old records from [facility].  Sign records release today for endometrial biopsy results. ? She will contact us if she has any further bleeding. ? I have explained to her that she should undergo endometrial biopsy if the records cannot be obtained. ?Orders ?No orders of the defined types were placed in this encounter. ? ? No orders of the defined types were placed in this encounter. ?  ?  F/U ? Return for Pt to contact us if symptoms worsen. ?I spent 23 minutes involved in the care of this patient preparing to see the patient by obtaining and reviewing her medical history (including labs, imaging tests and prior procedures), documenting clinical information in the electronic health record (EHR), counseling and coordinating care plans, writing and sending prescriptions, ordering tests or procedures and in direct communicating with the patient and medical staff discussing pertinent items from her history and physical exam. ? ?Finis Bud, M.D. ?09/13/2021 ?3:43 PM ? ? ? ? ?

## 2021-12-19 ENCOUNTER — Other Ambulatory Visit: Payer: Self-pay | Admitting: Physical Medicine & Rehabilitation

## 2021-12-19 DIAGNOSIS — M5441 Lumbago with sciatica, right side: Secondary | ICD-10-CM

## 2021-12-27 ENCOUNTER — Ambulatory Visit
Admission: RE | Admit: 2021-12-27 | Discharge: 2021-12-27 | Disposition: A | Discharge status: Home / Self Care | Payer: BC Managed Care – PPO | Source: Ambulatory Visit | Attending: Physical Medicine & Rehabilitation | Admitting: Physical Medicine & Rehabilitation

## 2021-12-27 DIAGNOSIS — M5441 Lumbago with sciatica, right side: Secondary | ICD-10-CM | POA: Insufficient documentation

## 2022-03-08 ENCOUNTER — Other Ambulatory Visit: Payer: Self-pay | Admitting: Primary Care

## 2022-03-08 DIAGNOSIS — Z1231 Encounter for screening mammogram for malignant neoplasm of breast: Secondary | ICD-10-CM

## 2022-04-05 ENCOUNTER — Ambulatory Visit
Admission: RE | Admit: 2022-04-05 | Discharge: 2022-04-05 | Disposition: A | Discharge status: Home / Self Care | Payer: BC Managed Care – PPO | Source: Ambulatory Visit | Attending: Primary Care | Admitting: Primary Care

## 2022-04-05 DIAGNOSIS — Z1231 Encounter for screening mammogram for malignant neoplasm of breast: Secondary | ICD-10-CM | POA: Diagnosis present

## 2022-04-06 ENCOUNTER — Encounter: Payer: Self-pay | Admitting: Primary Care

## 2022-04-11 ENCOUNTER — Other Ambulatory Visit: Payer: Self-pay | Admitting: Primary Care

## 2022-04-11 DIAGNOSIS — R928 Other abnormal and inconclusive findings on diagnostic imaging of breast: Secondary | ICD-10-CM

## 2022-04-11 DIAGNOSIS — N63 Unspecified lump in unspecified breast: Secondary | ICD-10-CM

## 2022-05-07 ENCOUNTER — Ambulatory Visit
Admission: RE | Admit: 2022-05-07 | Discharge: 2022-05-07 | Disposition: A | Discharge status: Home / Self Care | Payer: BC Managed Care – PPO | Source: Ambulatory Visit | Attending: Primary Care | Admitting: Primary Care

## 2022-05-07 DIAGNOSIS — R928 Other abnormal and inconclusive findings on diagnostic imaging of breast: Secondary | ICD-10-CM

## 2022-05-07 DIAGNOSIS — N63 Unspecified lump in unspecified breast: Secondary | ICD-10-CM | POA: Diagnosis present

## 2023-05-15 ENCOUNTER — Other Ambulatory Visit: Payer: Self-pay | Admitting: Primary Care

## 2023-05-15 DIAGNOSIS — Z1231 Encounter for screening mammogram for malignant neoplasm of breast: Secondary | ICD-10-CM

## 2023-05-21 ENCOUNTER — Ambulatory Visit
Admission: RE | Admit: 2023-05-21 | Discharge: 2023-05-21 | Disposition: A | Discharge status: Home / Self Care | Payer: BC Managed Care – PPO | Source: Ambulatory Visit | Attending: Primary Care | Admitting: Primary Care

## 2023-05-21 DIAGNOSIS — Z1231 Encounter for screening mammogram for malignant neoplasm of breast: Secondary | ICD-10-CM | POA: Insufficient documentation
